# Patient Record
Sex: Female | Born: 1955 | Race: White | Hispanic: No | Marital: Married | Smoking: Never smoker
Health system: Southern US, Community
[De-identification: ages and names within clinical notes are randomized; demographics above are authoritative.]

## PROBLEM LIST (undated history)

## (undated) HISTORY — PX: BUNIONECTOMY: SHX129

## (undated) HISTORY — PX: TONSILLECTOMY: SUR1361

---

## 1998-06-08 ENCOUNTER — Other Ambulatory Visit: Admission: RE | Admit: 1998-06-08 | Discharge: 1998-06-08 | Payer: Self-pay | Admitting: Obstetrics and Gynecology

## 2001-01-28 ENCOUNTER — Encounter: Payer: Self-pay | Admitting: Emergency Medicine

## 2001-01-28 ENCOUNTER — Encounter: Admission: RE | Admit: 2001-01-28 | Discharge: 2001-01-28 | Payer: Self-pay | Admitting: Emergency Medicine

## 2001-08-15 ENCOUNTER — Other Ambulatory Visit: Admission: RE | Admit: 2001-08-15 | Discharge: 2001-08-15 | Payer: Self-pay | Admitting: Emergency Medicine

## 2002-08-14 ENCOUNTER — Encounter: Admission: RE | Admit: 2002-08-14 | Discharge: 2002-08-14 | Payer: Self-pay | Admitting: Emergency Medicine

## 2002-08-14 ENCOUNTER — Encounter: Payer: Self-pay | Admitting: Emergency Medicine

## 2003-11-24 ENCOUNTER — Encounter: Admission: RE | Admit: 2003-11-24 | Discharge: 2003-11-24 | Payer: Self-pay | Admitting: Emergency Medicine

## 2006-04-02 ENCOUNTER — Encounter: Admission: RE | Admit: 2006-04-02 | Discharge: 2006-04-02 | Payer: Self-pay | Admitting: Emergency Medicine

## 2007-07-04 IMAGING — MG MM SCREEN MAMMOGRAM BILATERAL
4 series · 4 of 4 positions shown · non-contrast
Comparison: none

Bilateral CC and MLO view(s) were taken.

SCREENING MAMMOGRAM:
There is a fibroglandular pattern.  No masses or malignant type calcifications are identified.  
Compared with prior studies.

[R CC]
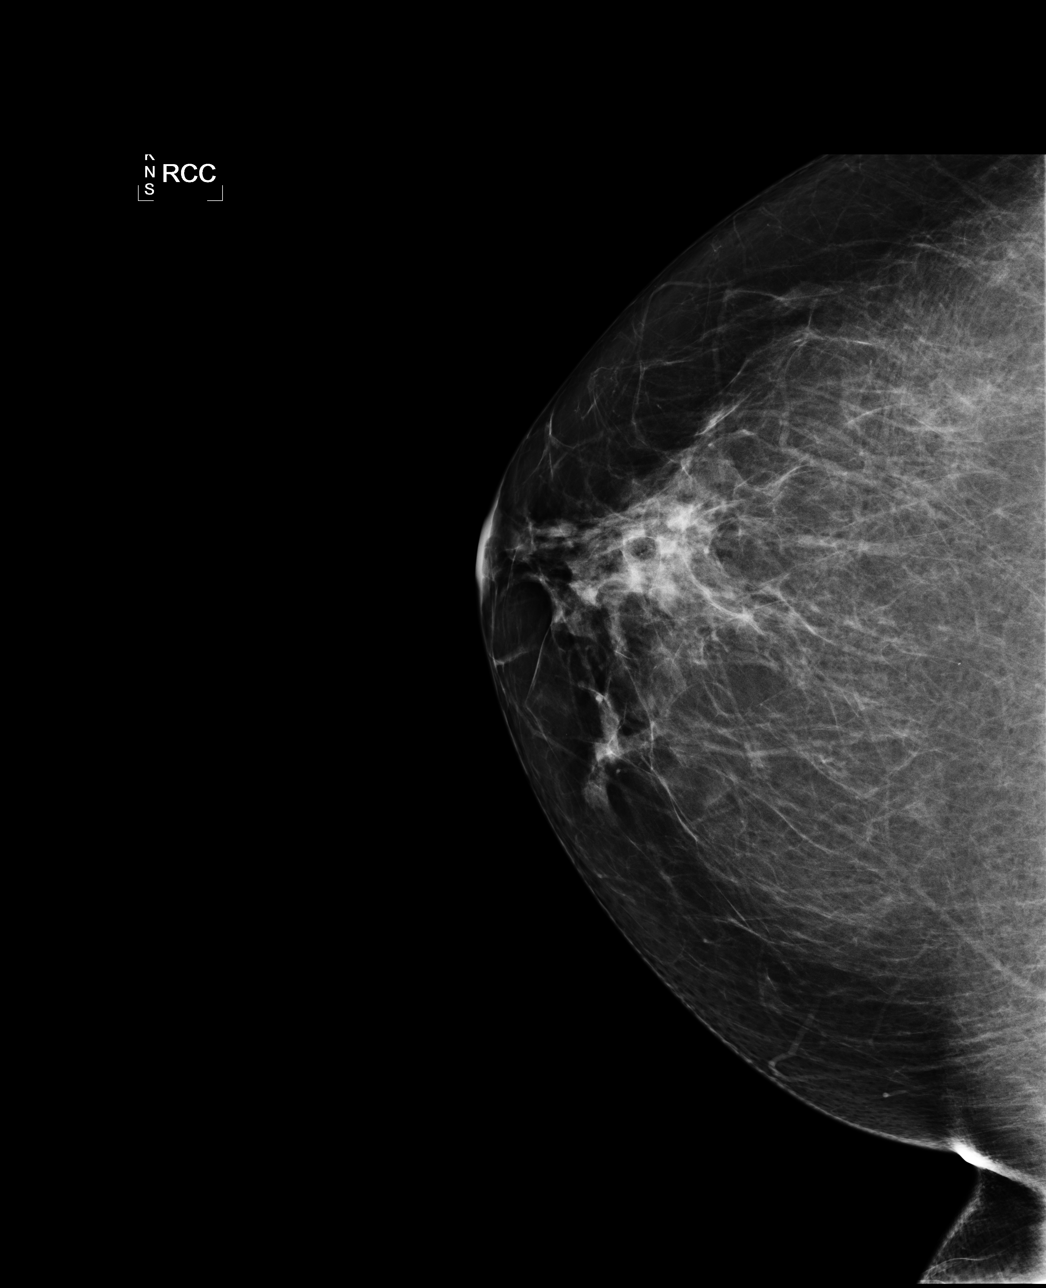

[L CC]
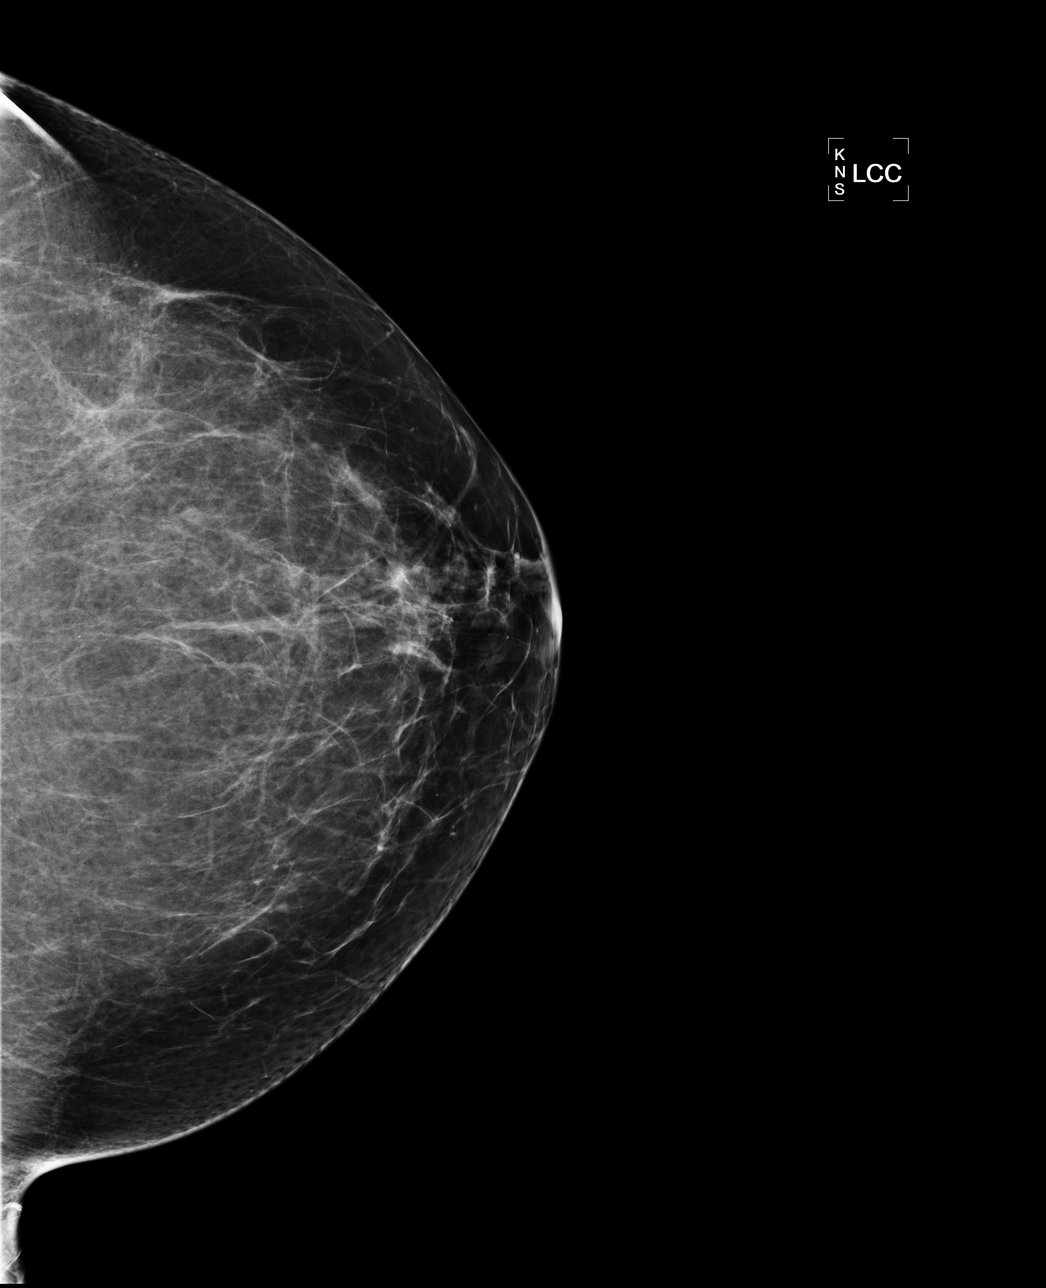

[L MLO]
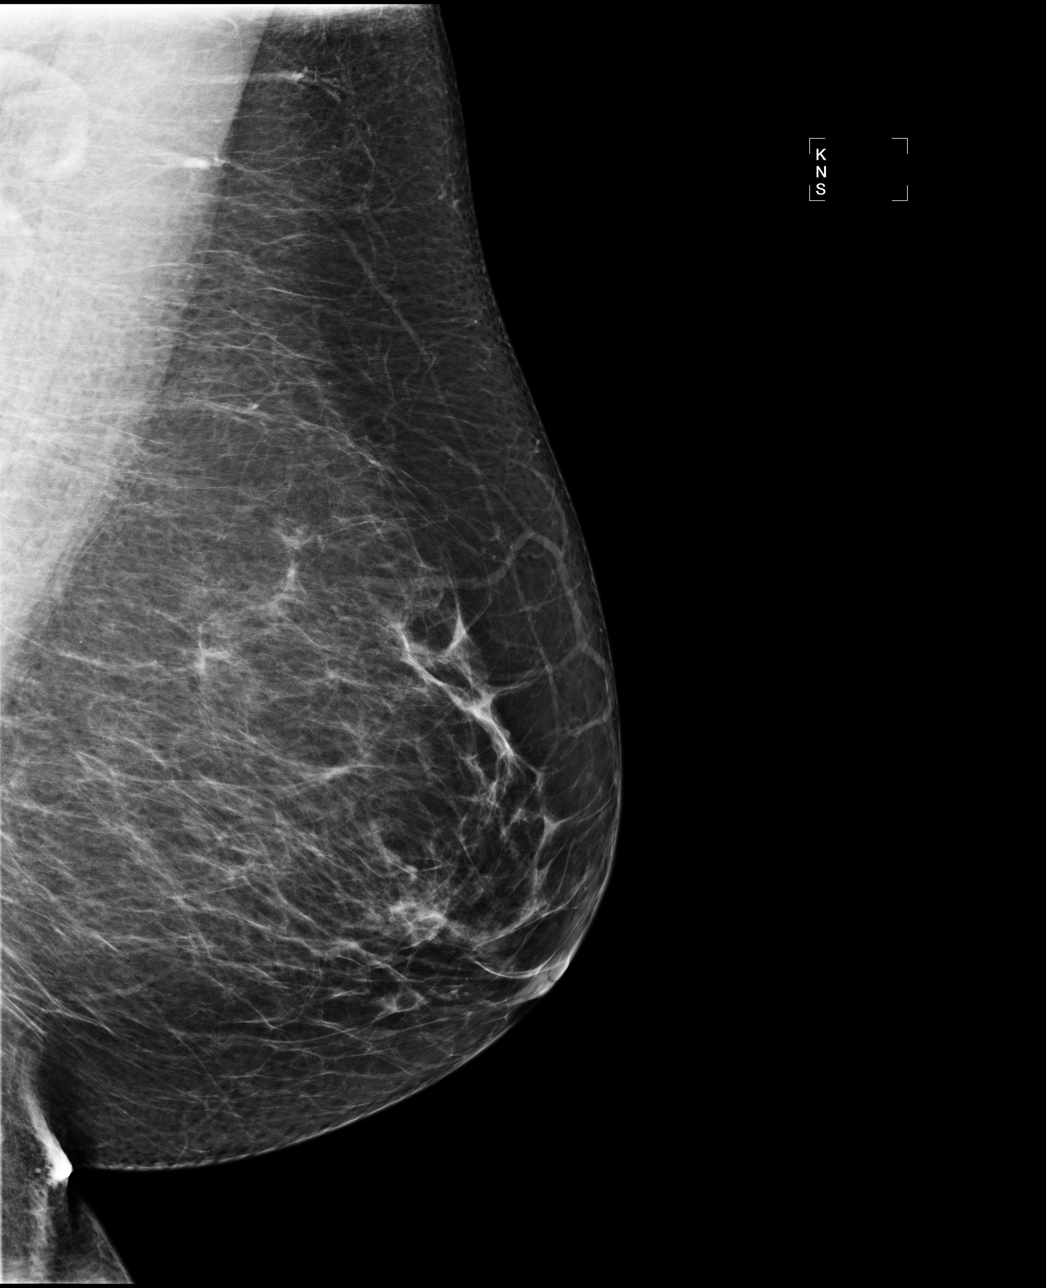

[R MLO]
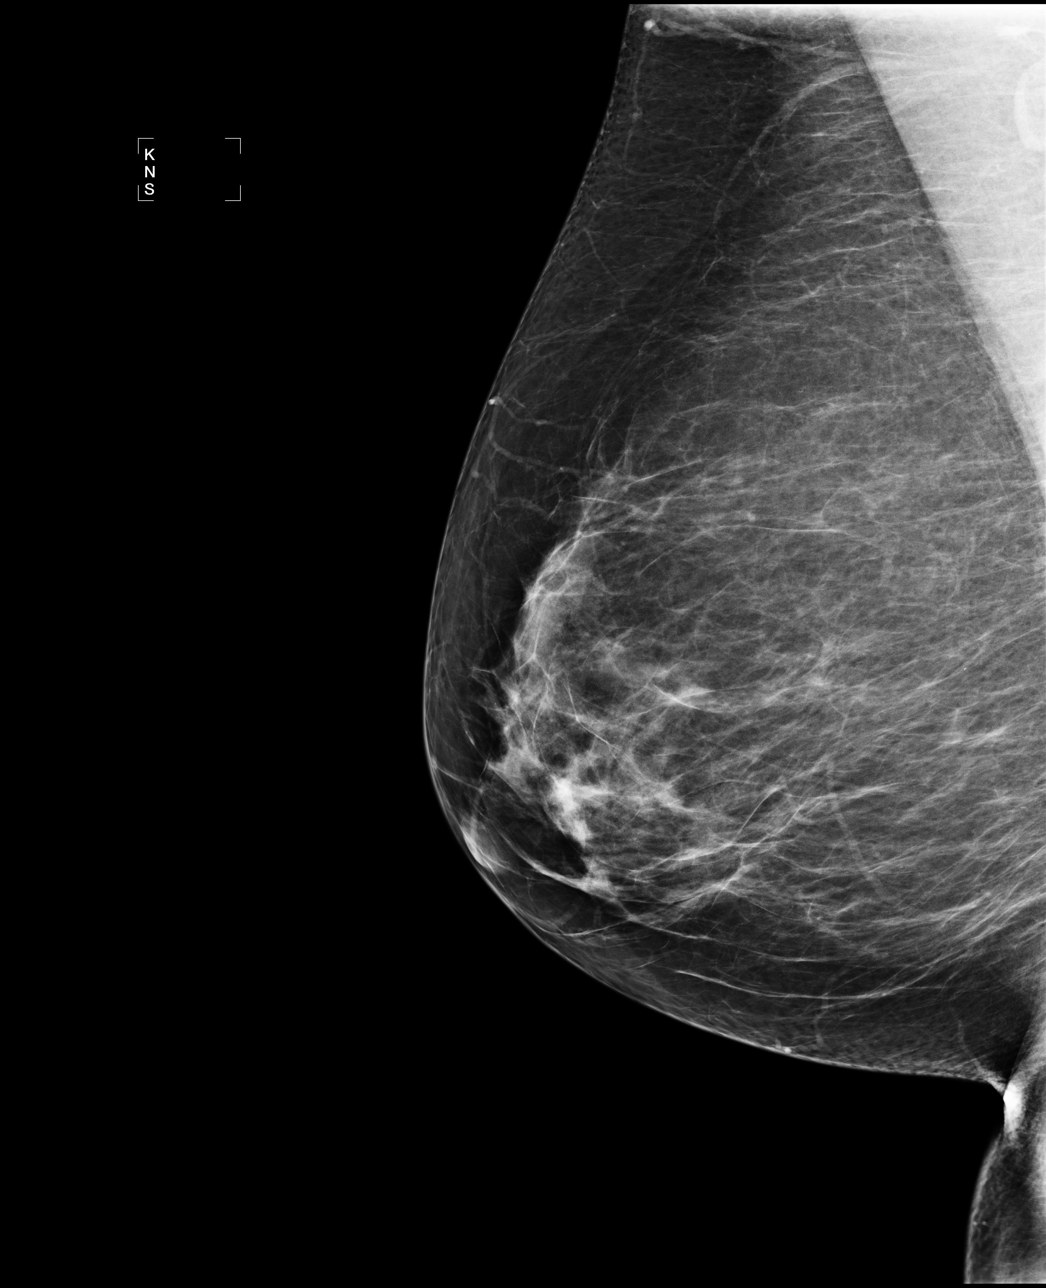

[4 of 4 positions shown; findings below may reference images not displayed]

IMPRESSION: No specific mammographic evidence of malignancy.  Next screening mammogram is recommended in one 
year.

ASSESSMENT: Negative - BI-RADS 1

Screening mammogram in 1 year.

## 2007-09-09 ENCOUNTER — Encounter: Admission: RE | Admit: 2007-09-09 | Discharge: 2007-09-09 | Payer: Self-pay | Admitting: Emergency Medicine

## 2007-09-12 ENCOUNTER — Encounter: Admission: RE | Admit: 2007-09-12 | Discharge: 2007-09-12 | Payer: Self-pay | Admitting: Emergency Medicine

## 2008-03-11 ENCOUNTER — Encounter: Admission: RE | Admit: 2008-03-11 | Discharge: 2008-03-11 | Payer: Self-pay | Admitting: Emergency Medicine

## 2008-09-09 ENCOUNTER — Encounter: Admission: RE | Admit: 2008-09-09 | Discharge: 2008-09-09 | Payer: Self-pay | Admitting: Emergency Medicine

## 2009-03-18 IMAGING — CR DG KNEE*R* EXAM
4 series · 4 of 4 positions shown · non-contrast
Comparison: none

[other (1 of 2)]
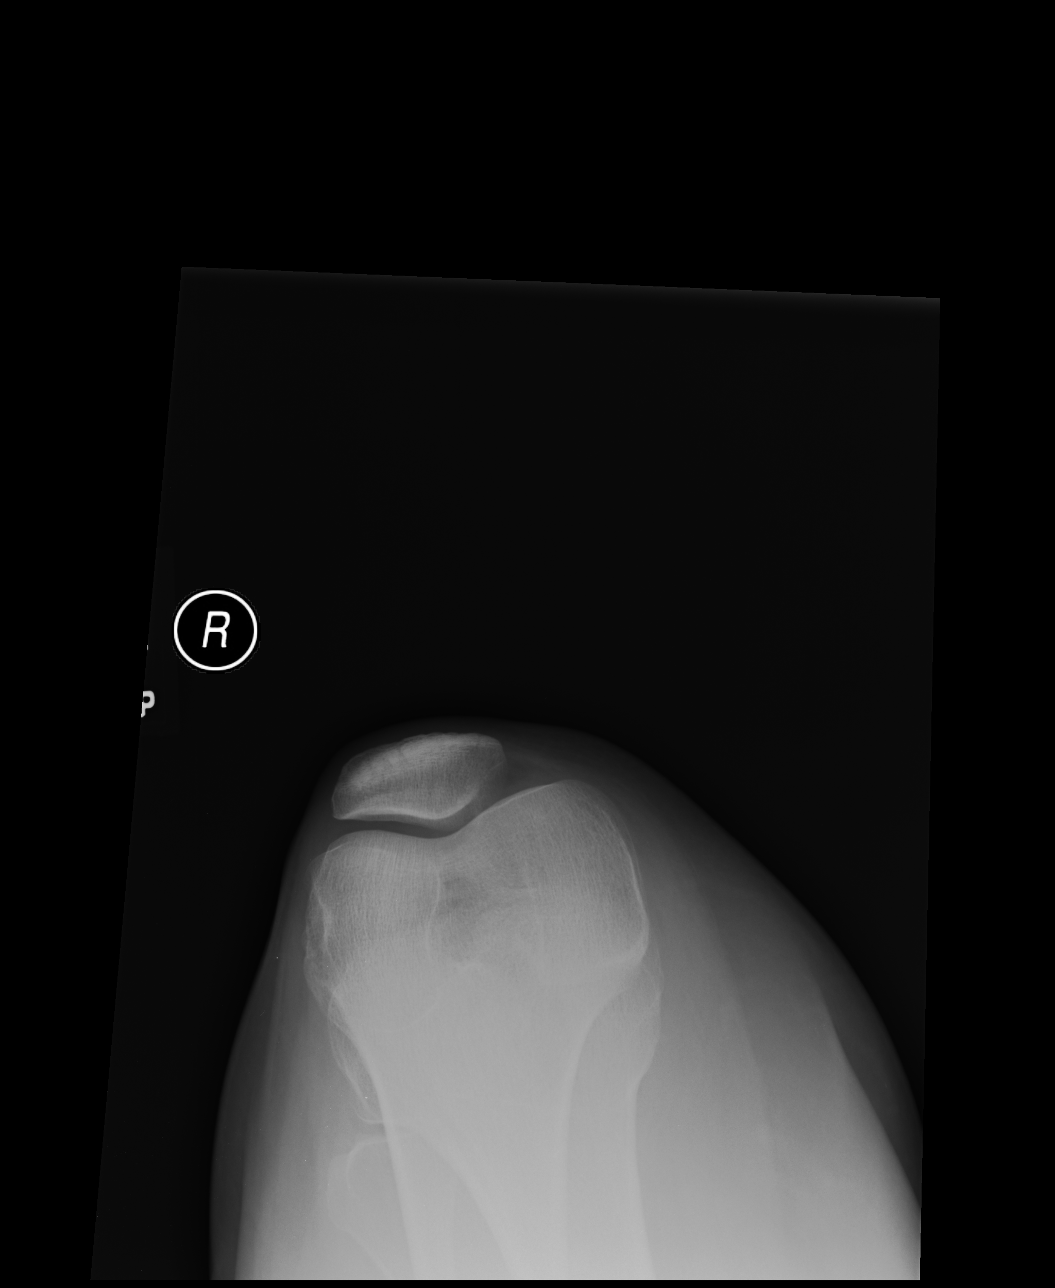

[AP]
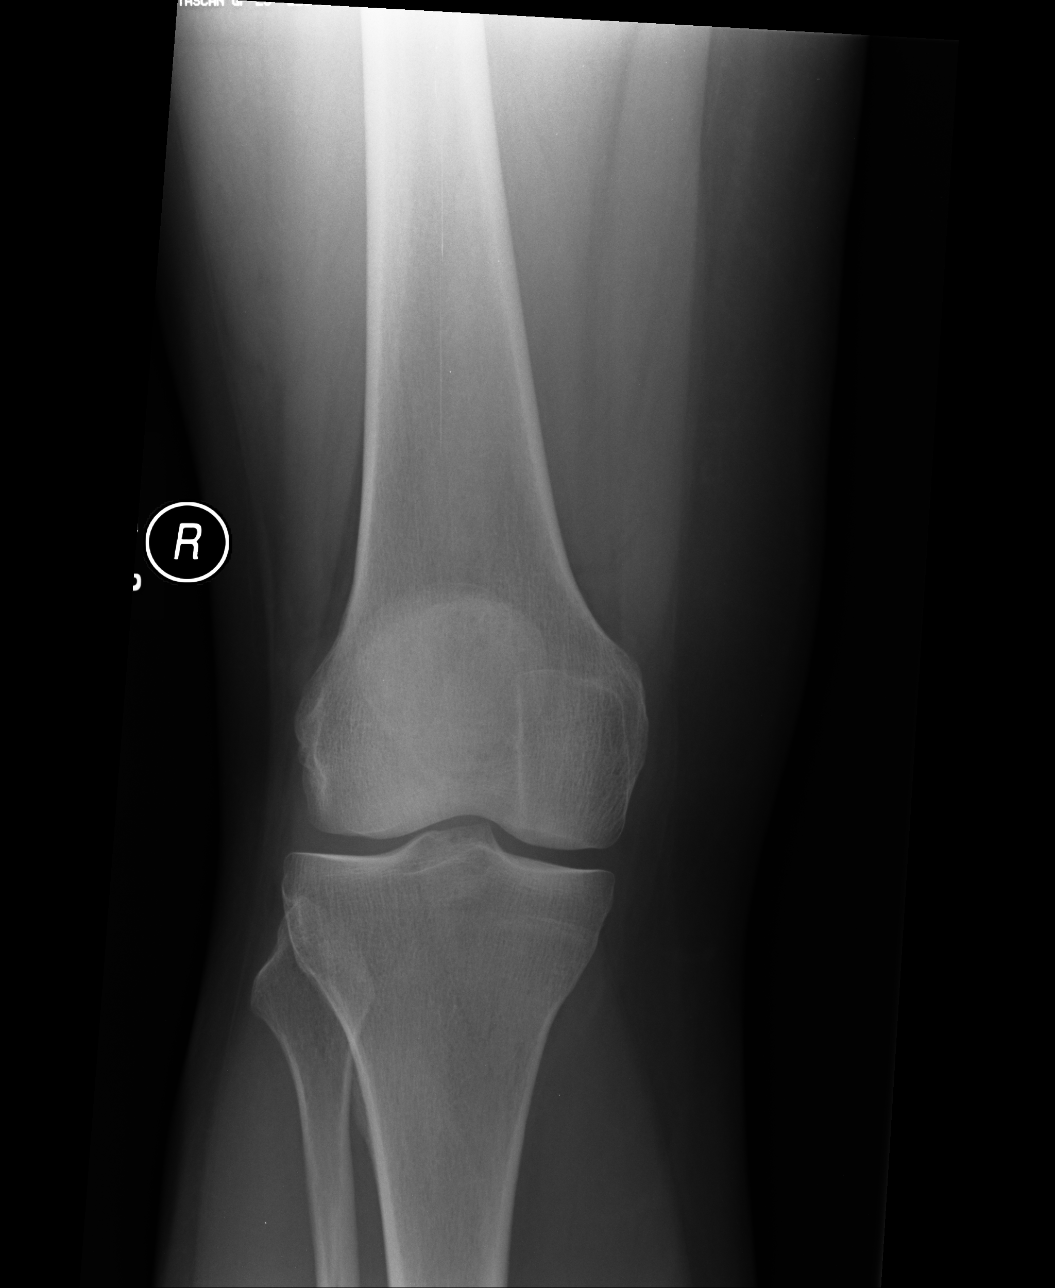

[lateral]
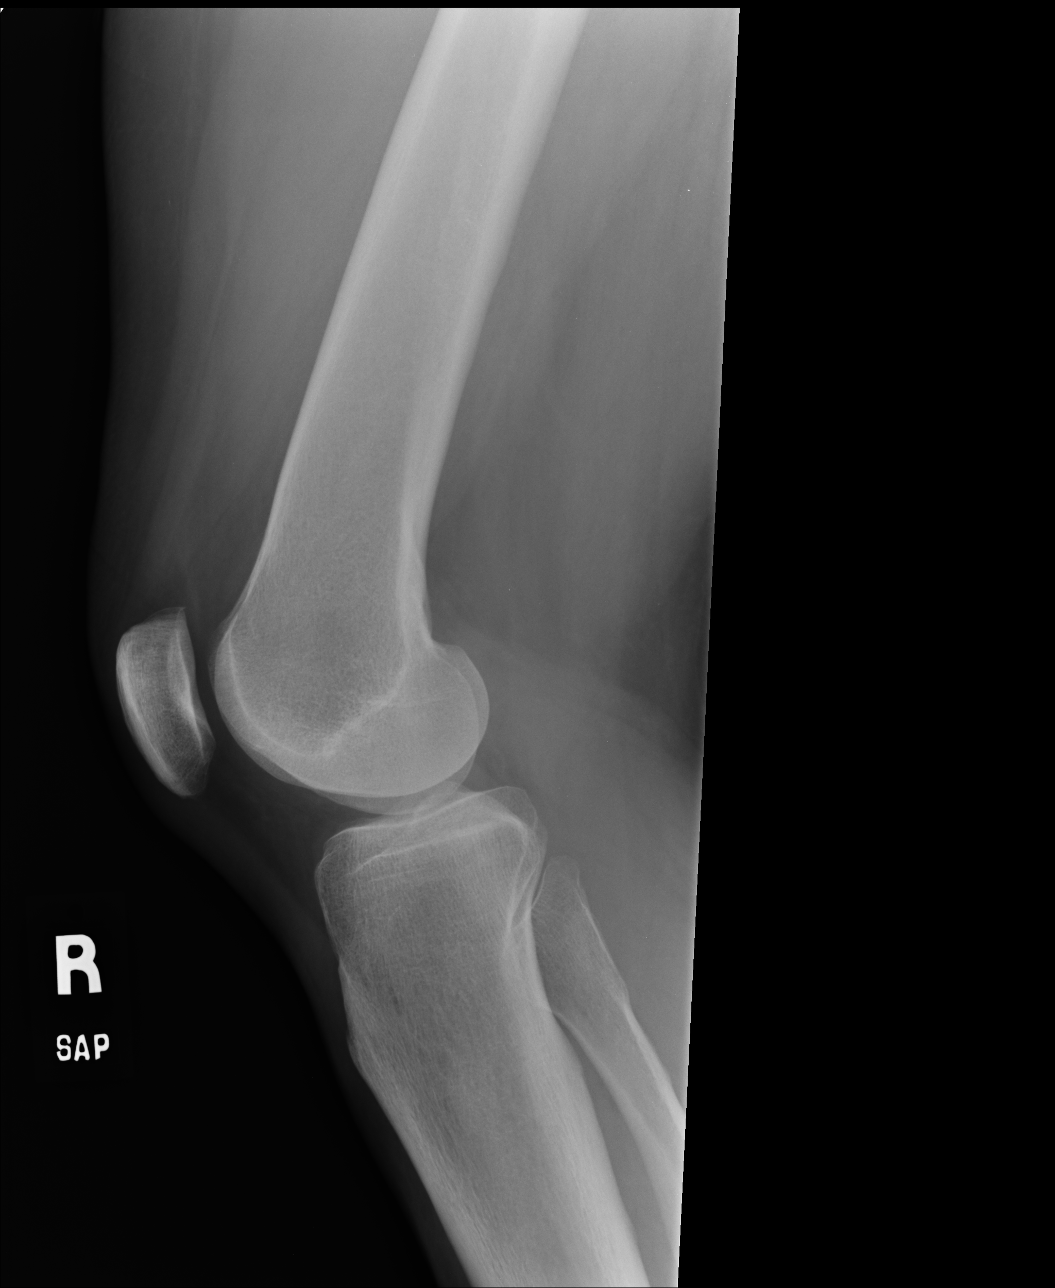

[other (2 of 2)]
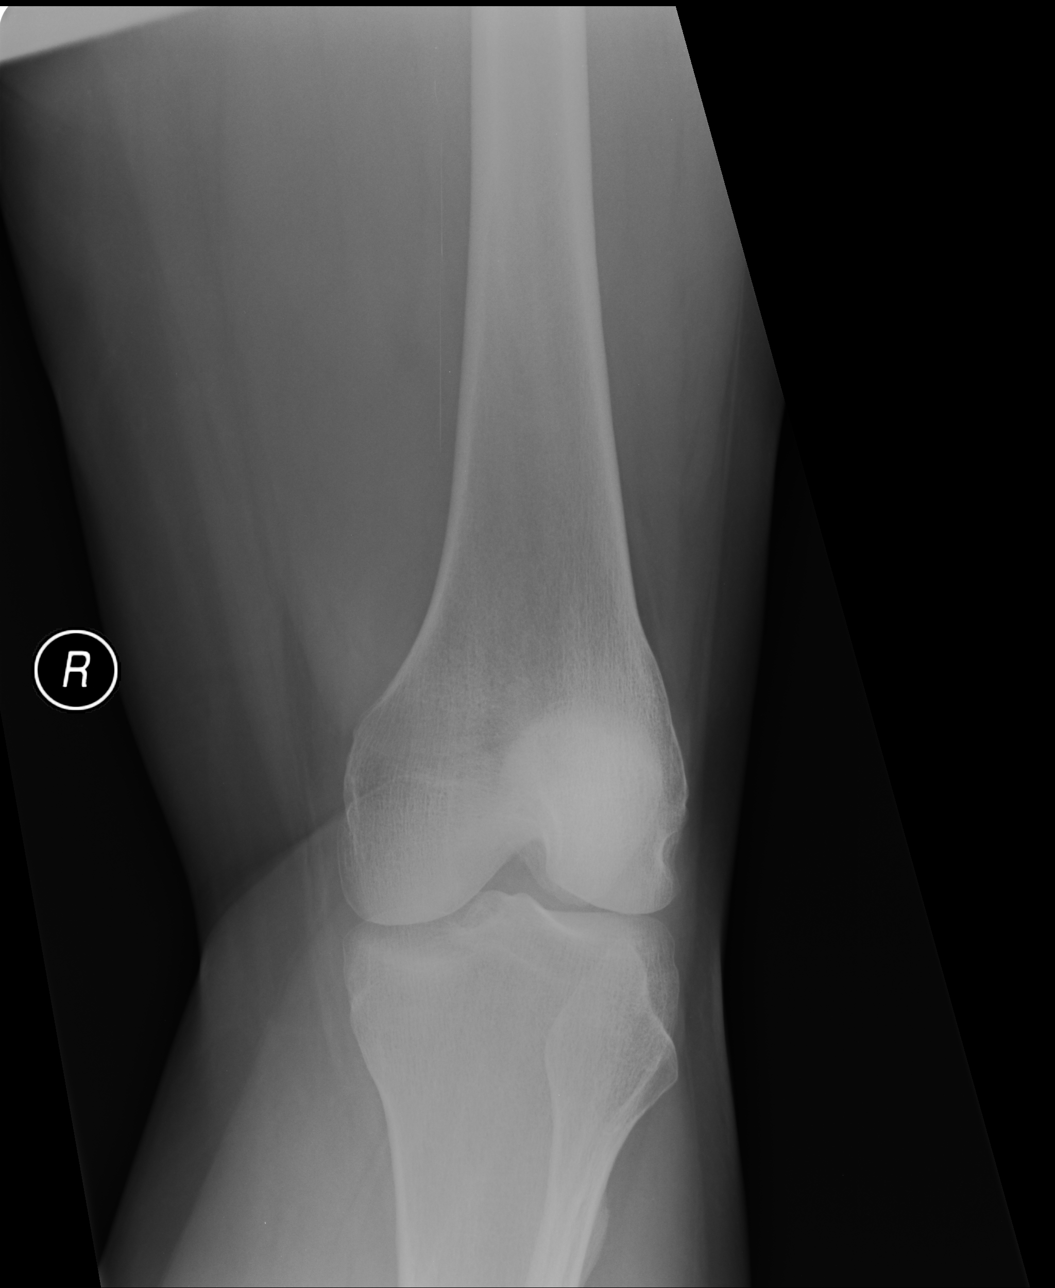

[4 of 4 positions shown; findings below may reference images not displayed]

Canned report from images found in remote index.

Refer to host system for actual result text.

## 2009-09-13 ENCOUNTER — Encounter: Admission: RE | Admit: 2009-09-13 | Discharge: 2009-09-13 | Payer: Self-pay | Admitting: Family Medicine

## 2010-09-15 ENCOUNTER — Encounter: Admission: RE | Admit: 2010-09-15 | Discharge: 2010-09-15 | Payer: Self-pay | Admitting: Internal Medicine

## 2011-09-12 ENCOUNTER — Other Ambulatory Visit: Payer: Self-pay | Admitting: Physician Assistant

## 2011-09-12 DIAGNOSIS — Z1231 Encounter for screening mammogram for malignant neoplasm of breast: Secondary | ICD-10-CM

## 2011-09-18 ENCOUNTER — Ambulatory Visit
Admission: RE | Admit: 2011-09-18 | Discharge: 2011-09-18 | Disposition: A | Discharge status: Home / Self Care | Payer: BC Managed Care – PPO | Source: Ambulatory Visit | Attending: Physician Assistant | Admitting: Physician Assistant

## 2011-09-18 DIAGNOSIS — Z1231 Encounter for screening mammogram for malignant neoplasm of breast: Secondary | ICD-10-CM

## 2012-09-12 ENCOUNTER — Other Ambulatory Visit: Payer: Self-pay | Admitting: Physician Assistant

## 2012-09-12 DIAGNOSIS — Z1231 Encounter for screening mammogram for malignant neoplasm of breast: Secondary | ICD-10-CM

## 2012-10-07 ENCOUNTER — Ambulatory Visit
Admission: RE | Admit: 2012-10-07 | Discharge: 2012-10-07 | Disposition: A | Discharge status: Home / Self Care | Payer: BC Managed Care – PPO | Source: Ambulatory Visit | Attending: Physician Assistant | Admitting: Physician Assistant

## 2012-10-07 DIAGNOSIS — Z1231 Encounter for screening mammogram for malignant neoplasm of breast: Secondary | ICD-10-CM

## 2013-09-24 ENCOUNTER — Other Ambulatory Visit: Payer: Self-pay

## 2013-09-24 DIAGNOSIS — Z1231 Encounter for screening mammogram for malignant neoplasm of breast: Secondary | ICD-10-CM

## 2013-10-14 ENCOUNTER — Ambulatory Visit
Admission: RE | Admit: 2013-10-14 | Discharge: 2013-10-14 | Disposition: A | Discharge status: Home / Self Care | Payer: BC Managed Care – PPO | Source: Ambulatory Visit

## 2013-10-14 DIAGNOSIS — Z1231 Encounter for screening mammogram for malignant neoplasm of breast: Secondary | ICD-10-CM

## 2014-10-05 ENCOUNTER — Other Ambulatory Visit: Payer: Self-pay

## 2014-10-05 DIAGNOSIS — Z1231 Encounter for screening mammogram for malignant neoplasm of breast: Secondary | ICD-10-CM

## 2014-10-14 ENCOUNTER — Ambulatory Visit (INDEPENDENT_AMBULATORY_CARE_PROVIDER_SITE_OTHER): Payer: BC Managed Care – PPO | Admitting: Physician Assistant

## 2014-10-14 VITALS — BP 126/84 | HR 63 | Temp 97.8°F | Resp 20 | Ht 64.0 in | Wt 175.8 lb

## 2014-10-14 DIAGNOSIS — R05 Cough: Secondary | ICD-10-CM

## 2014-10-14 DIAGNOSIS — R059 Cough, unspecified: Secondary | ICD-10-CM

## 2014-10-14 DIAGNOSIS — R062 Wheezing: Secondary | ICD-10-CM

## 2014-10-14 MED ORDER — IPRATROPIUM BROMIDE 0.02 % IN SOLN
0.5000 mg | Freq: Once | RESPIRATORY_TRACT | Status: AC
  Administered 2014-10-14: 0.5 mg via RESPIRATORY_TRACT

## 2014-10-14 MED ORDER — HYDROCODONE-HOMATROPINE 5-1.5 MG/5ML PO SYRP: 5.0000 mL | ORAL_SOLUTION | Freq: Three times a day (TID) | ORAL | Status: DC | PRN

## 2014-10-14 MED ORDER — BENZONATATE 100 MG PO CAPS: 100.0000 mg | ORAL_CAPSULE | Freq: Three times a day (TID) | ORAL | Status: DC | PRN

## 2014-10-14 MED ORDER — ALBUTEROL SULFATE HFA 108 (90 BASE) MCG/ACT IN AERS: 2.0000 | INHALATION_SPRAY | RESPIRATORY_TRACT | Status: AC | PRN

## 2014-10-14 MED ORDER — ALBUTEROL SULFATE (2.5 MG/3ML) 0.083% IN NEBU
2.5000 mg | INHALATION_SOLUTION | Freq: Once | RESPIRATORY_TRACT | Status: AC
  Administered 2014-10-14: 2.5 mg via RESPIRATORY_TRACT

## 2014-10-14 NOTE — Progress Notes (Signed)
I was directly involved with the patient's care and agree with the physical, diagnosis and treatment plan.  

## 2014-10-14 NOTE — Patient Instructions (Signed)
Your cough is most likely due to a viral upper respiratory infection. Please take the tessalon as prescribed.  Please take the hycodan as prescribed. This will make you sleepy so you may want to take the tessalon during the day and hycodan at night. Your wheezing sounded better after the breathing treatment.  Please use the albuterol every 4-6 hours as needed for the wheeze.  If your wheezing continues despite using the albuterol, if you start to develop fevers, or if your cough worsens or becomes productive, please return to the clinic.

## 2014-10-14 NOTE — Progress Notes (Signed)
Subjective:    Patient ID: Taylor CoveVictoria Hunkins, female    DOB: 01/20/1956, 58 y.o.   MRN: [id number]  No primary care provider on file.  Chief Complaint  Patient presents with  . Cough    cough that is non productive all week.  cough has been worse at night until today--coughing all day even with taking delsym.    There are no active problems to display for this patient.  Prior to Admission medications   Not on File   Medications, allergies, past medical history, surgical history, family history, social history and problem list reviewed and updated.  HPI  [age] yof with no PMH presents today with sore throat, cough, and wheezing.  Sx intially started with sore throat 5 days ago. This has gradually resolved but cough started 3 days. Has been non-productive but has been keeping her up at night. Coughing throughout the day. Delsym helped initially but no help today. Shona NeedlesYest and today she and her husband noticed that she was wheezing occasionally. No hx asthma. She thinks she had wheezing many yrs ago as she remembers being prescribed albuterol yrs ago. Has not used in yrs though. She denies ear pain, fever, chills, abd pain, N/V, or diarrhea. Pos SOB with her coughing spells but otherwise none.   She had similar sx with cough and sore throat one month which resolved after a week, no wheeze at that time.   She has seasonal allergies in the fall. Takes antihistamine daily.  Got flu vaccine this year.   Review of Systems No CP. See HPI.     Objective:   Physical Exam  Constitutional: She is oriented to person, place, and time. She appears well-developed and well-nourished.  Non-toxic appearance. She does not have a sickly appearance. She does not appear ill. No distress.  BP 126/84 mmHg  Pulse 63  Temp(Src) 97.8 F (36.6 C) (Oral)  Resp 20  Ht 5\' 4"  (1.626 m)  Wt 175 lb 12.8 oz (79.742 kg)  BMI 30.16 kg/m2  SpO2 97%   HENT:  Head: Normocephalic and atraumatic.  Right Ear: Hearing,  tympanic membrane, external ear and ear canal normal.  Left Ear: Hearing, tympanic membrane, external ear and ear canal normal.  Nose: Nose normal. No mucosal edema or rhinorrhea. Right sinus exhibits no maxillary sinus tenderness and no frontal sinus tenderness. Left sinus exhibits no maxillary sinus tenderness and no frontal sinus tenderness.  Mouth/Throat: Uvula is midline, oropharynx is clear and moist and mucous membranes are normal. No oropharyngeal exudate, posterior oropharyngeal edema, posterior oropharyngeal erythema or tonsillar abscesses.  Several petechial spots on uvula but no surrounding erythema.   Cardiovascular: Normal rate, regular rhythm, S1 normal, S2 normal and normal heart sounds.  Exam reveals no gallop.   No murmur heard. Pulmonary/Chest: Effort normal. No accessory muscle usage. No tachypnea. No respiratory distress. She has no decreased breath sounds. She has wheezes in the right middle field, the right lower field and the left upper field. She has rhonchi in the right upper field, the right middle field, the right lower field, the left upper field and the left middle field. She has no rales.  Lymphadenopathy:       Head (right side): No submental, no submandibular and no tonsillar adenopathy present.       Head (left side): No submental, no submandibular and no tonsillar adenopathy present.    She has no cervical adenopathy.  Neurological: She is alert and oriented to person, place, and time.  Psychiatric: She has a normal mood and affect. Her speech is normal.   Breathing tx with albuterol/ipratroprium nebulizer. Breath sounds post tx: No wheezes. No rales. Rhonchi RLL.      Assessment & Plan:   [age] yof with no PMH presents today with sore throat, cough, and wheezing.  Wheezing - Plan: albuterol (PROVENTIL) (2.5 MG/3ML) 0.083% nebulizer solution 2.5 mg, ipratropium (ATROVENT) nebulizer solution 0.5 mg, albuterol (PROVENTIL HFA;VENTOLIN HFA) 108 (90 BASE) MCG/ACT  inhaler --Most likely secondary to viral uri --Resolved in clinic with breathing tx --Albuterol prn --RTC if wheezing doesn't resolve with albuterol  Cough - Plan: benzonatate (TESSALON) 100 MG capsule, HYDROcodone-homatropine (HYCODAN) 5-1.5 MG/5ML syrup --Hycodan night --Tessalon day --RTC if not resolving in 5 days or if becomes productive for several days. --RTC if fevers begin  Donnajean Lopesodd M. Kamyla Olejnik, PA-C Physician Assistant-Certified [facility]  10/14/2014 7:55 PM

## 2014-10-19 ENCOUNTER — Ambulatory Visit
Admission: RE | Admit: 2014-10-19 | Discharge: 2014-10-19 | Disposition: A | Discharge status: Home / Self Care | Payer: BC Managed Care – PPO | Source: Ambulatory Visit

## 2014-10-19 DIAGNOSIS — Z1231 Encounter for screening mammogram for malignant neoplasm of breast: Secondary | ICD-10-CM

## 2015-10-11 ENCOUNTER — Other Ambulatory Visit: Payer: Self-pay

## 2015-10-11 DIAGNOSIS — Z1231 Encounter for screening mammogram for malignant neoplasm of breast: Secondary | ICD-10-CM

## 2015-10-26 ENCOUNTER — Ambulatory Visit: Payer: BC Managed Care – PPO

## 2015-11-24 ENCOUNTER — Ambulatory Visit
Admission: RE | Admit: 2015-11-24 | Discharge: 2015-11-24 | Disposition: A | Discharge status: Home / Self Care | Payer: BC Managed Care – PPO | Source: Ambulatory Visit

## 2015-11-24 DIAGNOSIS — Z1231 Encounter for screening mammogram for malignant neoplasm of breast: Secondary | ICD-10-CM

## 2016-11-21 ENCOUNTER — Other Ambulatory Visit: Payer: Self-pay | Admitting: Physician Assistant

## 2016-11-21 DIAGNOSIS — Z1231 Encounter for screening mammogram for malignant neoplasm of breast: Secondary | ICD-10-CM

## 2016-12-12 ENCOUNTER — Ambulatory Visit
Admission: RE | Admit: 2016-12-12 | Discharge: 2016-12-12 | Disposition: A | Discharge status: Home / Self Care | Payer: BC Managed Care – PPO | Source: Ambulatory Visit | Attending: Physician Assistant | Admitting: Physician Assistant

## 2016-12-12 DIAGNOSIS — Z1231 Encounter for screening mammogram for malignant neoplasm of breast: Secondary | ICD-10-CM

## 2017-12-23 ENCOUNTER — Other Ambulatory Visit: Payer: Self-pay | Admitting: Physician Assistant

## 2017-12-23 DIAGNOSIS — Z1231 Encounter for screening mammogram for malignant neoplasm of breast: Secondary | ICD-10-CM

## 2018-01-10 ENCOUNTER — Ambulatory Visit
Admission: RE | Admit: 2018-01-10 | Discharge: 2018-01-10 | Disposition: A | Discharge status: Home / Self Care | Payer: BC Managed Care – PPO | Source: Ambulatory Visit | Attending: Physician Assistant | Admitting: Physician Assistant

## 2018-01-10 DIAGNOSIS — Z1231 Encounter for screening mammogram for malignant neoplasm of breast: Secondary | ICD-10-CM

## 2019-01-15 ENCOUNTER — Other Ambulatory Visit: Payer: Self-pay | Admitting: Physician Assistant

## 2019-01-15 DIAGNOSIS — Z1231 Encounter for screening mammogram for malignant neoplasm of breast: Secondary | ICD-10-CM

## 2019-02-09 ENCOUNTER — Ambulatory Visit
Admission: RE | Admit: 2019-02-09 | Discharge: 2019-02-09 | Disposition: A | Discharge status: Home / Self Care | Payer: BC Managed Care – PPO | Source: Ambulatory Visit | Attending: Physician Assistant | Admitting: Physician Assistant

## 2019-02-09 DIAGNOSIS — Z1231 Encounter for screening mammogram for malignant neoplasm of breast: Secondary | ICD-10-CM

## 2020-02-09 ENCOUNTER — Other Ambulatory Visit: Payer: Self-pay | Admitting: Physician Assistant

## 2020-02-09 DIAGNOSIS — Z1231 Encounter for screening mammogram for malignant neoplasm of breast: Secondary | ICD-10-CM

## 2020-03-31 ENCOUNTER — Ambulatory Visit
Admission: RE | Admit: 2020-03-31 | Discharge: 2020-03-31 | Disposition: A | Discharge status: Home / Self Care | Payer: BC Managed Care – PPO | Source: Ambulatory Visit | Attending: Physician Assistant | Admitting: Physician Assistant

## 2020-03-31 ENCOUNTER — Other Ambulatory Visit: Payer: Self-pay

## 2020-03-31 DIAGNOSIS — Z1231 Encounter for screening mammogram for malignant neoplasm of breast: Secondary | ICD-10-CM

## 2020-05-12 IMAGING — MG DIGITAL SCREENING BILATERAL MAMMOGRAM WITH TOMO AND CAD
8 series · 8 of 24 positions shown · non-contrast
Comparison: Previous exam(s).

CLINICAL DATA: Screening.

EXAM:
DIGITAL SCREENING BILATERAL MAMMOGRAM WITH TOMO AND CAD

[R CC synth-2D]
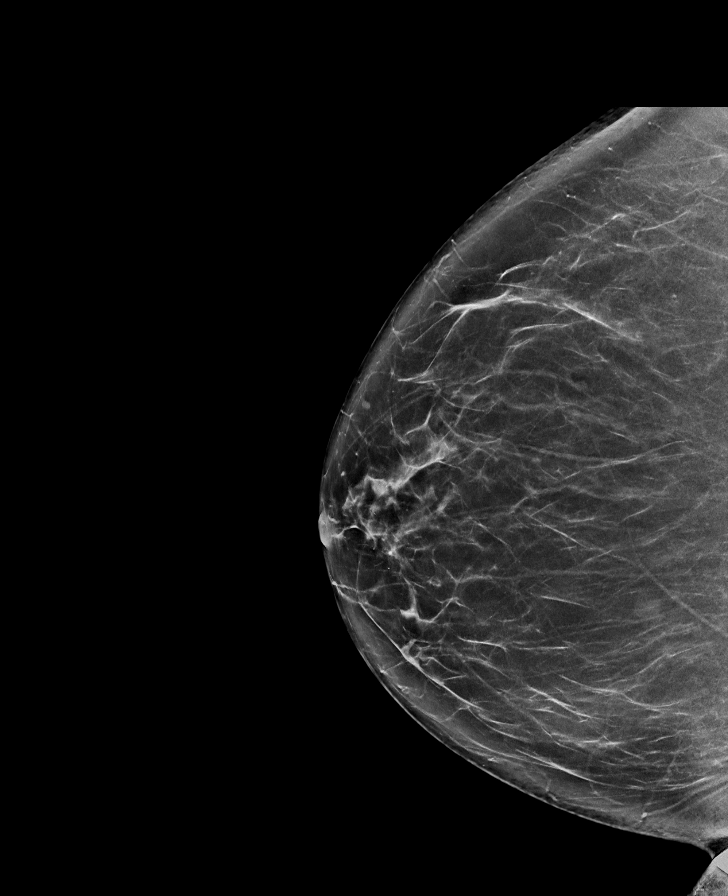

[L MLO synth-2D]
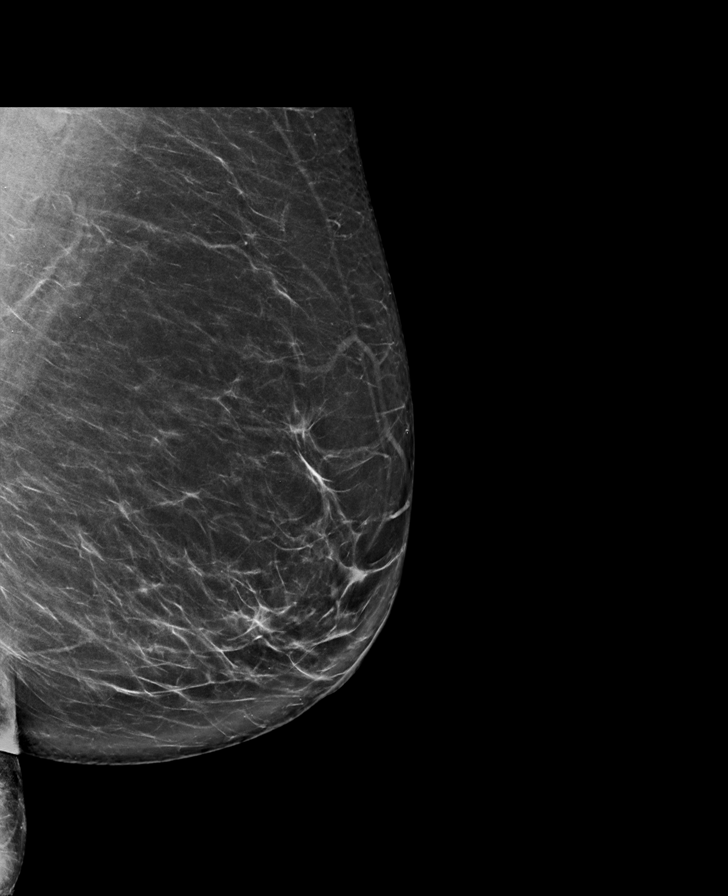

[R MLO synth-2D]
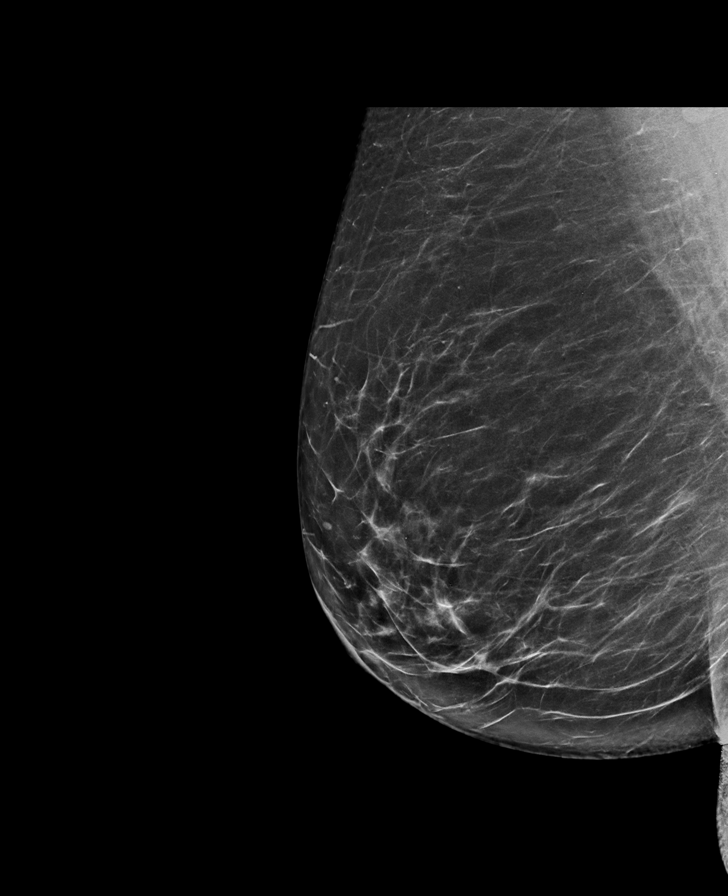

[L CC synth-2D]
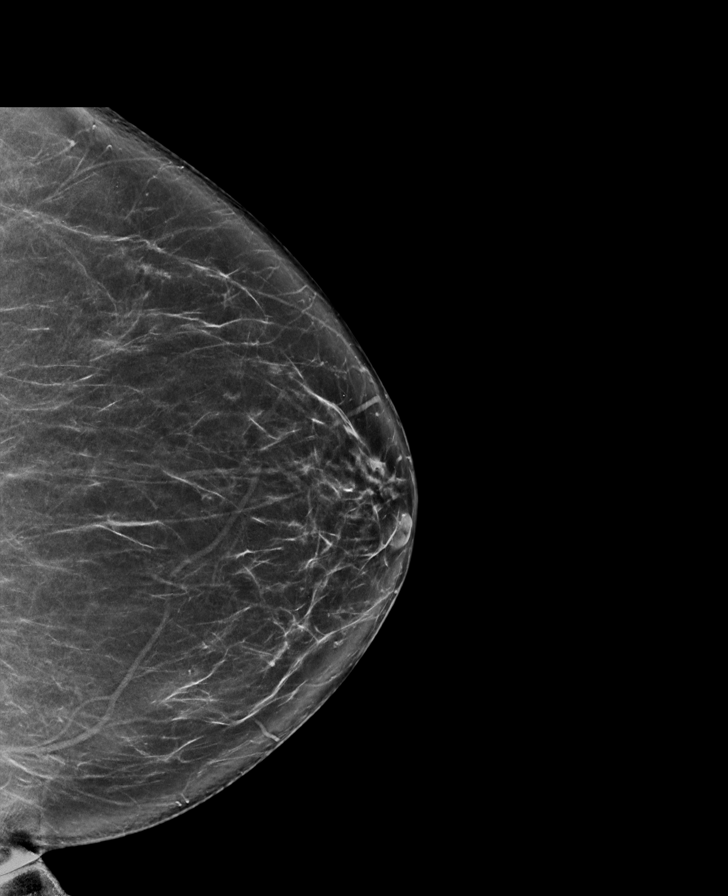

[R MLO tomo · tomo slice 45/89.0]
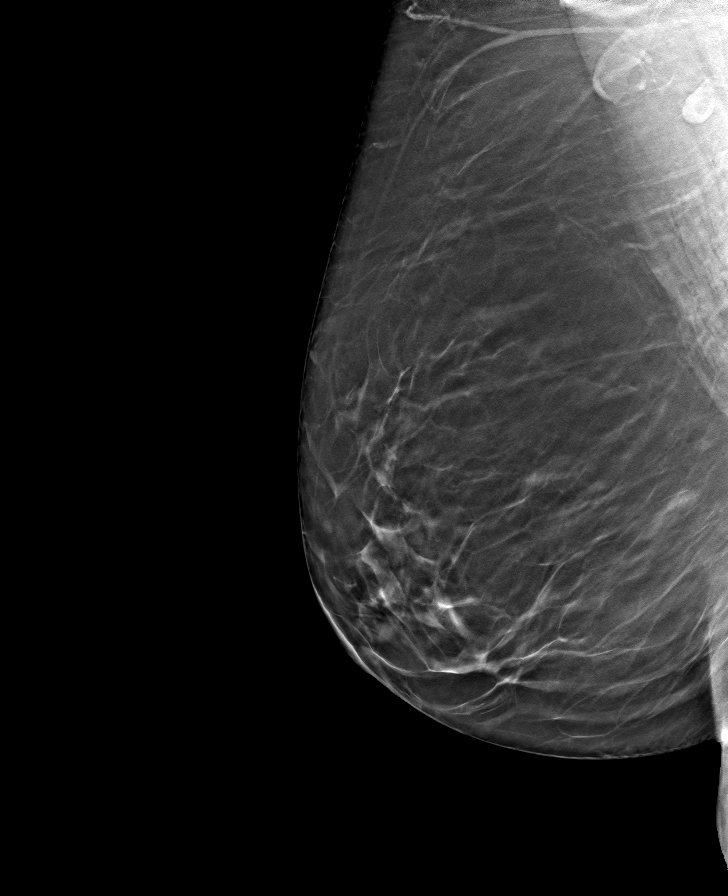

[L CC tomo · tomo slice 43/84.0]
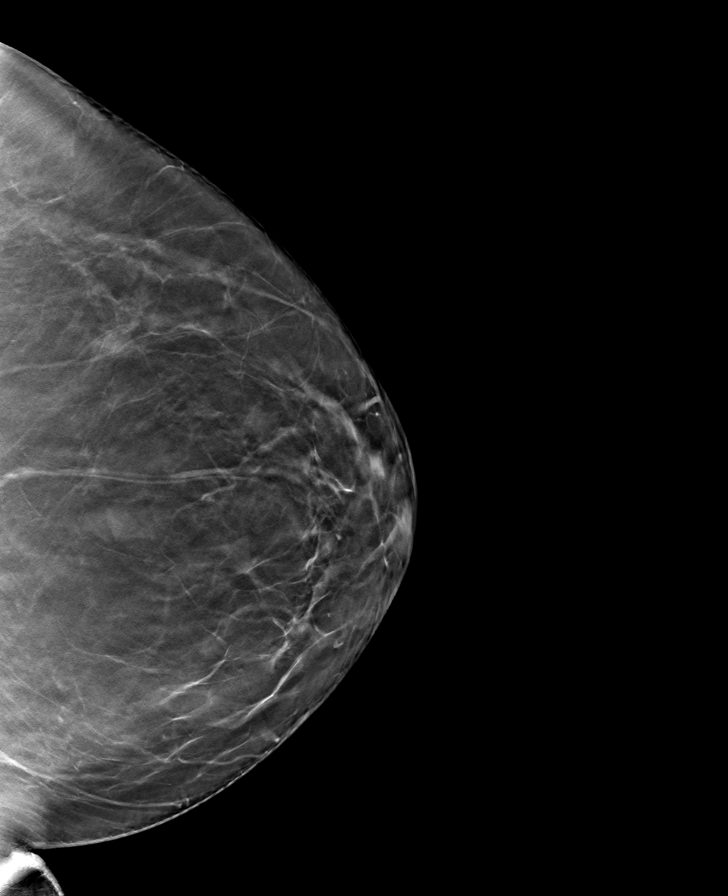

[R CC tomo · tomo slice 43/85.0]
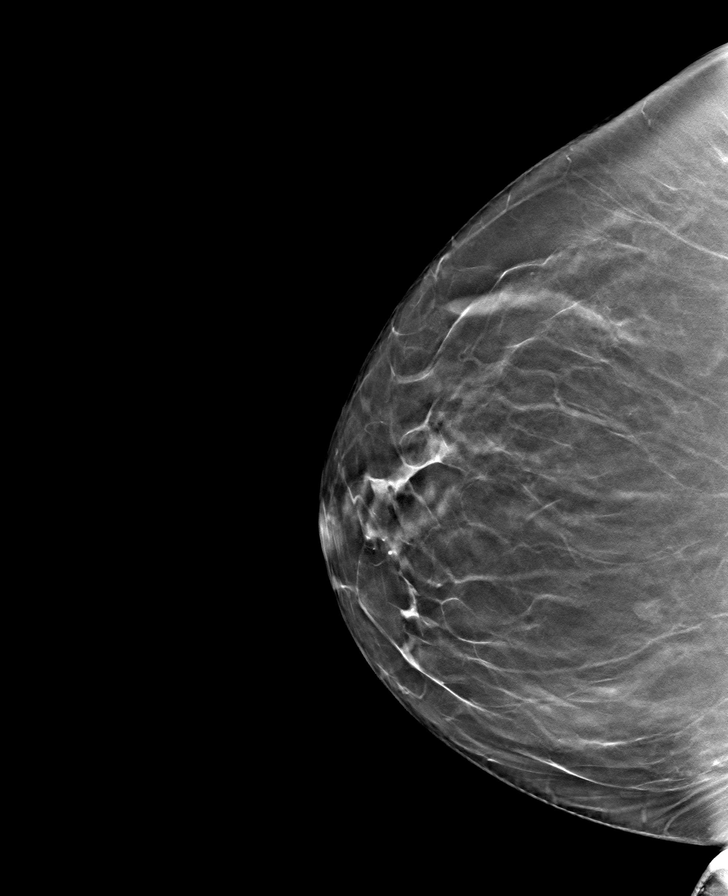

[L MLO tomo · tomo slice 45/88.0]
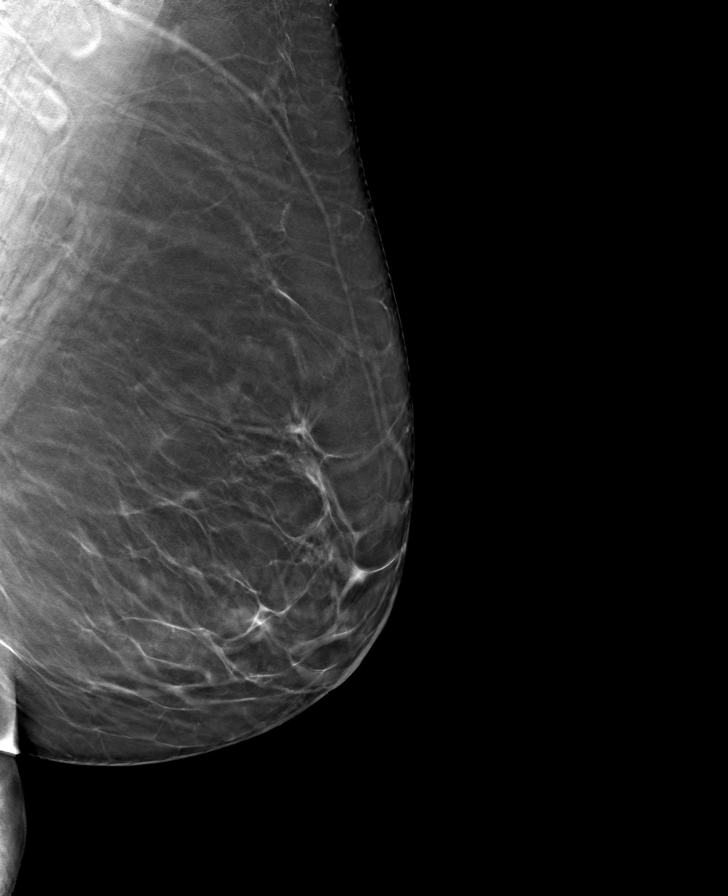

[8 of 24 positions shown; findings below may reference images not displayed]

ACR Breast Density Category b: There are scattered areas of
fibroglandular density.
FINDINGS: There are no findings suspicious for malignancy. Images were
processed with CAD.
IMPRESSION: No mammographic evidence of malignancy. A result letter of this
screening mammogram will be mailed directly to the patient.

RECOMMENDATION:
Screening mammogram in one year. (Code:[id number])

BI-RADS CATEGORY  1: Negative.

## 2021-03-13 ENCOUNTER — Other Ambulatory Visit: Payer: Self-pay | Admitting: Physician Assistant

## 2021-03-13 DIAGNOSIS — Z1231 Encounter for screening mammogram for malignant neoplasm of breast: Secondary | ICD-10-CM

## 2021-05-03 ENCOUNTER — Ambulatory Visit: Payer: BC Managed Care – PPO

## 2021-05-05 ENCOUNTER — Ambulatory Visit
Admission: RE | Admit: 2021-05-05 | Discharge: 2021-05-05 | Disposition: A | Discharge status: Home / Self Care | Payer: BC Managed Care – PPO | Source: Ambulatory Visit | Attending: Physician Assistant | Admitting: Physician Assistant

## 2021-05-05 ENCOUNTER — Other Ambulatory Visit: Payer: Self-pay

## 2021-05-05 DIAGNOSIS — Z1231 Encounter for screening mammogram for malignant neoplasm of breast: Secondary | ICD-10-CM

## 2021-07-02 IMAGING — MG DIGITAL SCREENING BILAT W/ TOMO W/ CAD
8 series · 8 of 24 positions shown · non-contrast
Comparison: Previous exam(s).

CLINICAL DATA: Screening.

EXAM:
DIGITAL SCREENING BILATERAL MAMMOGRAM WITH TOMO AND CAD

[R CC synth-2D]
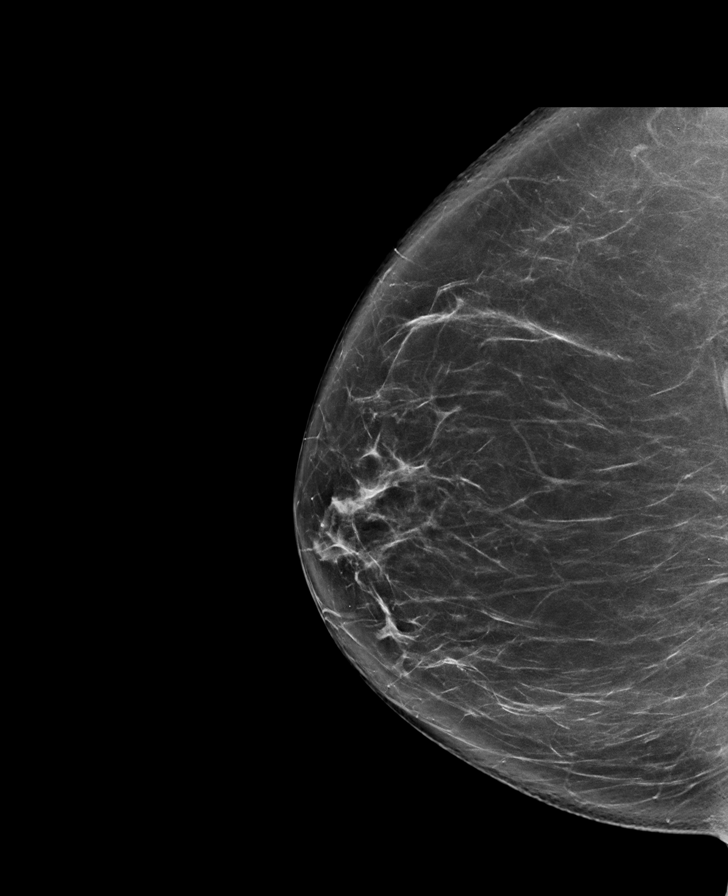

[L CC synth-2D]
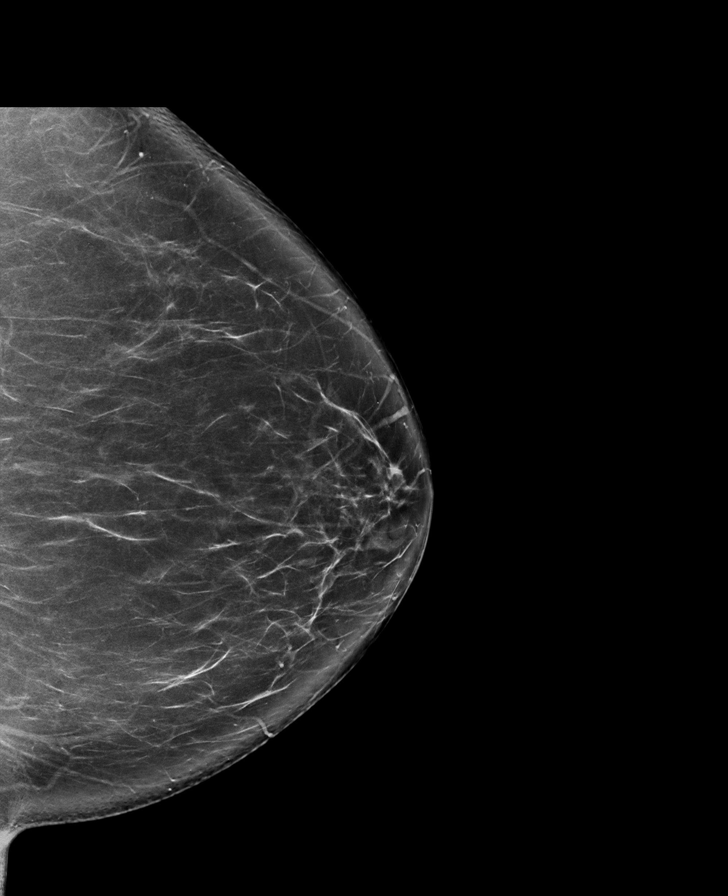

[R MLO synth-2D]
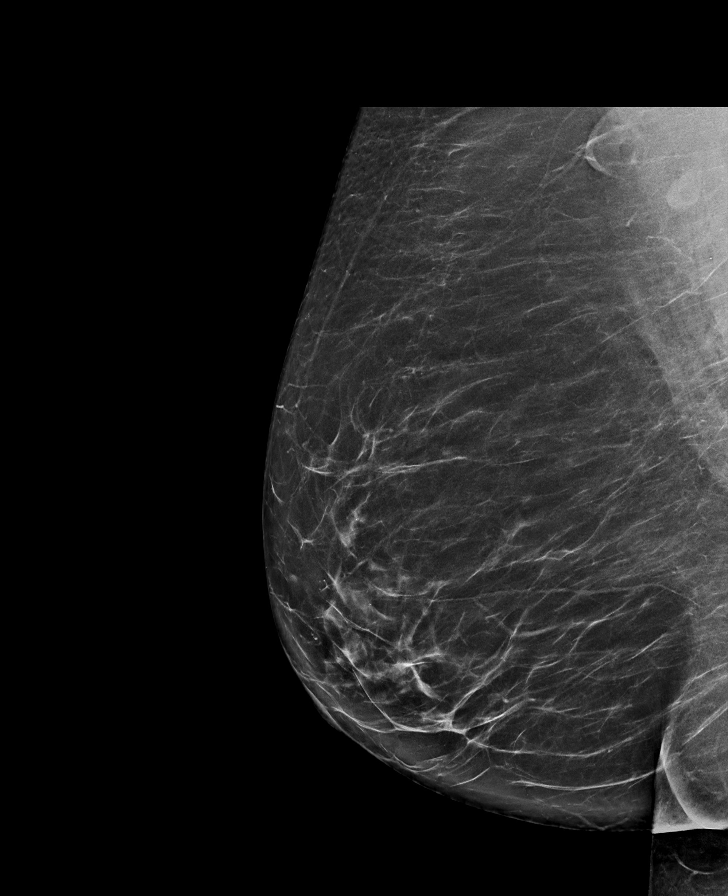

[L MLO synth-2D]
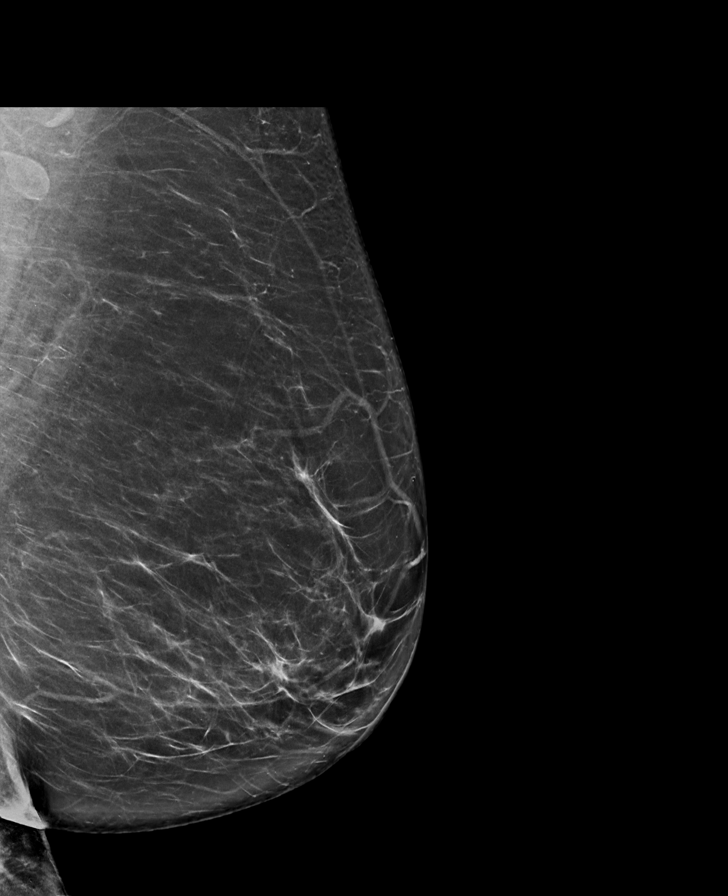

[R MLO tomo · tomo slice 45/88.0]
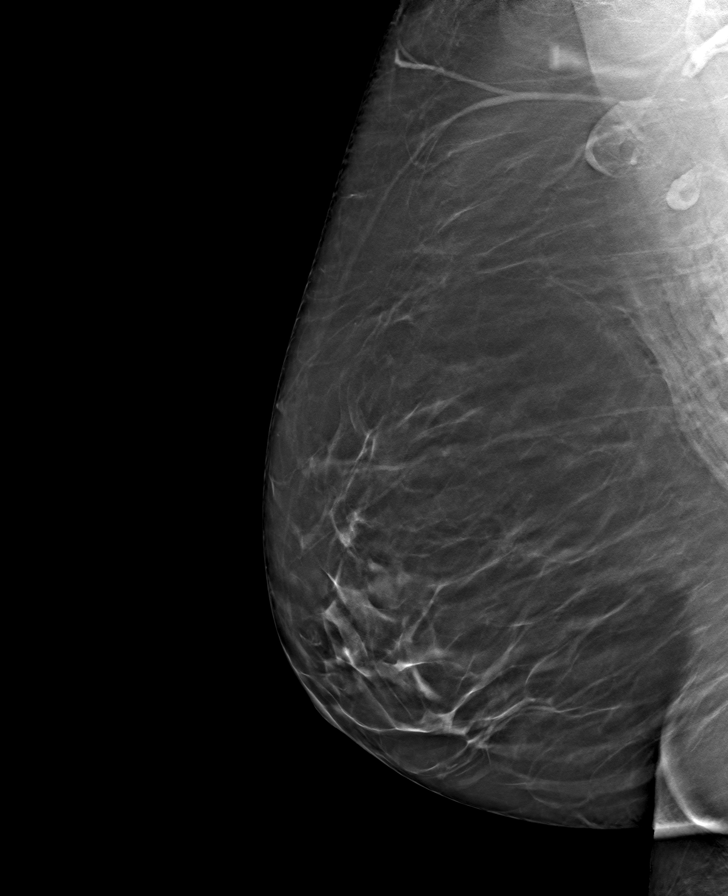

[L MLO tomo · tomo slice 45/89.0]
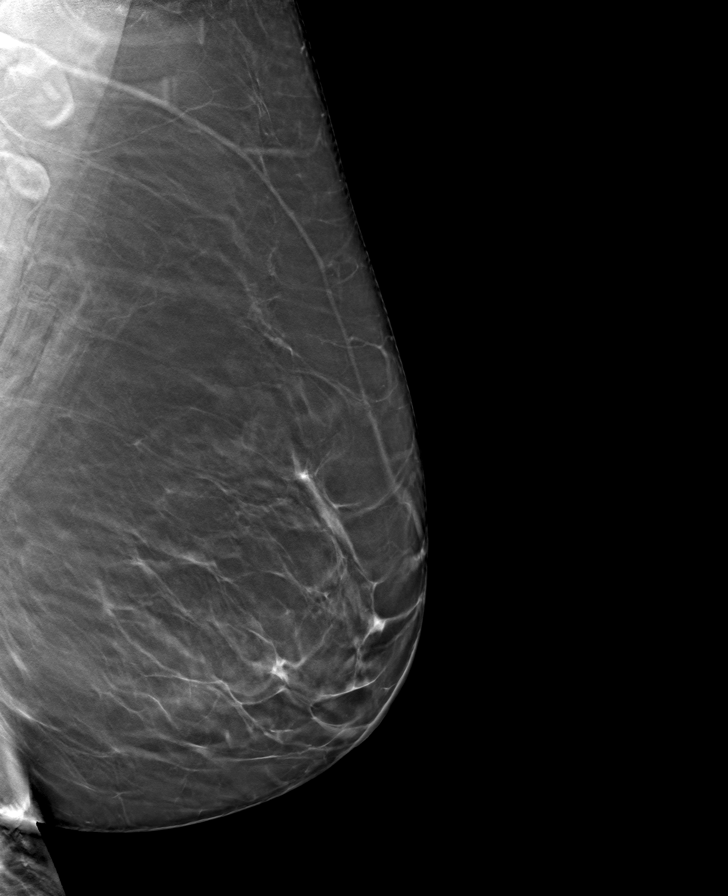

[R CC tomo · tomo slice 45/88.0]
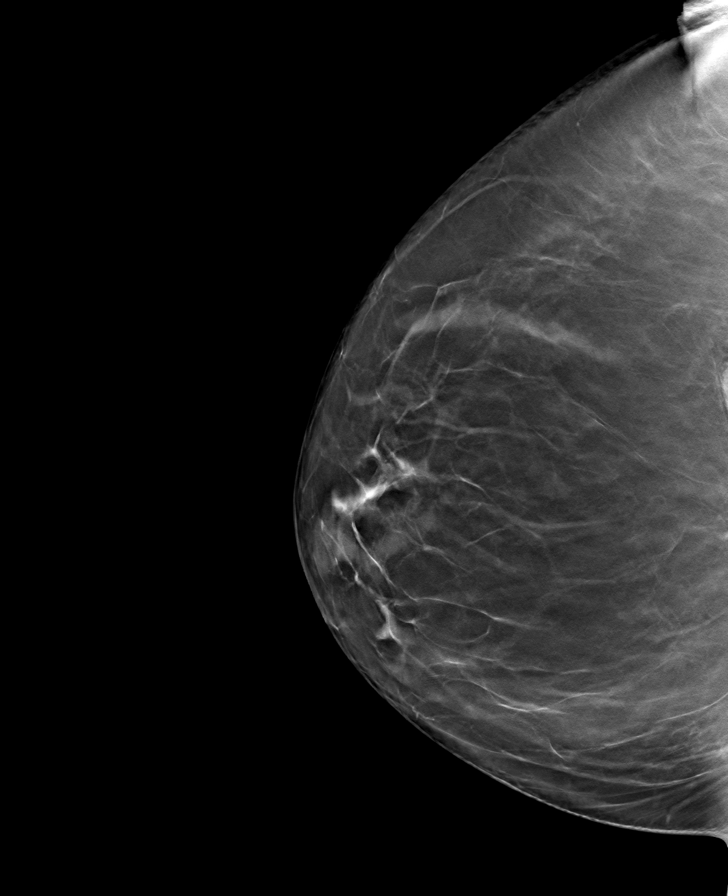

[L CC tomo · tomo slice 45/88.0]
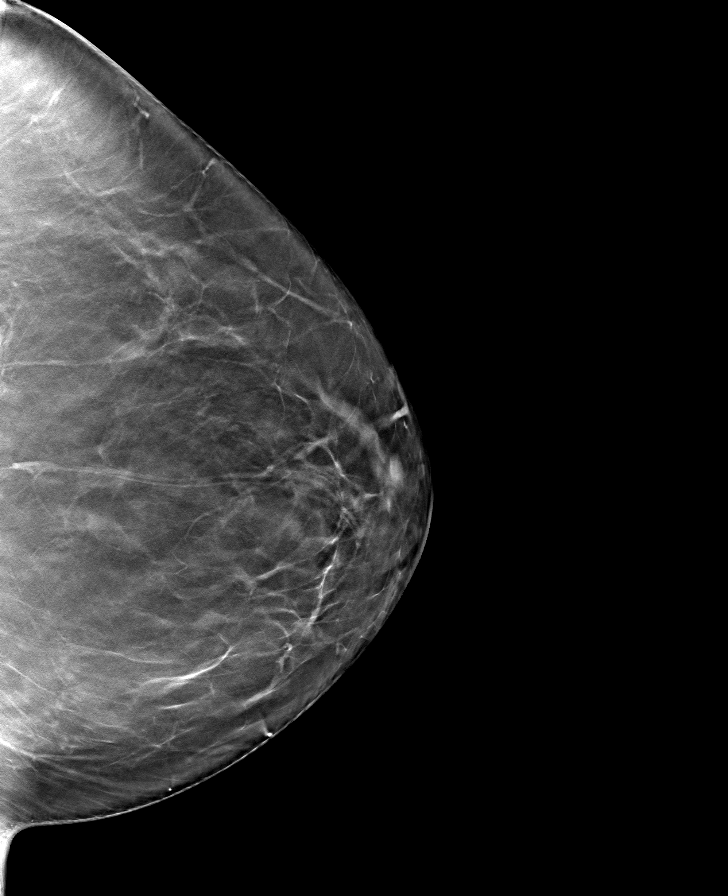

[8 of 24 positions shown; findings below may reference images not displayed]

ACR Breast Density Category b: There are scattered areas of
fibroglandular density.
FINDINGS: There are no findings suspicious for malignancy. Images were
processed with CAD.
IMPRESSION: No mammographic evidence of malignancy. A result letter of this
screening mammogram will be mailed directly to the patient.

RECOMMENDATION:
Screening mammogram in one year. (Code:[id number])

BI-RADS CATEGORY  1: Negative.

## 2021-09-27 ENCOUNTER — Other Ambulatory Visit: Payer: Self-pay | Admitting: Physician Assistant

## 2021-09-28 ENCOUNTER — Other Ambulatory Visit: Payer: Self-pay | Admitting: Physician Assistant

## 2021-09-28 DIAGNOSIS — N644 Mastodynia: Secondary | ICD-10-CM

## 2021-10-11 ENCOUNTER — Other Ambulatory Visit: Payer: Self-pay

## 2021-10-11 ENCOUNTER — Ambulatory Visit
Admission: RE | Admit: 2021-10-11 | Discharge: 2021-10-11 | Disposition: A | Discharge status: Home / Self Care | Payer: BC Managed Care – PPO | Source: Ambulatory Visit | Attending: Physician Assistant | Admitting: Physician Assistant

## 2021-10-11 ENCOUNTER — Ambulatory Visit: Payer: BC Managed Care – PPO

## 2021-10-11 DIAGNOSIS — N644 Mastodynia: Secondary | ICD-10-CM

## 2022-03-27 ENCOUNTER — Ambulatory Visit
Admission: EM | Admit: 2022-03-27 | Discharge: 2022-03-27 | Disposition: A | Discharge status: Home / Self Care | Payer: BC Managed Care – PPO

## 2022-03-27 ENCOUNTER — Encounter: Payer: Self-pay | Admitting: Urgent Care

## 2022-03-27 ENCOUNTER — Other Ambulatory Visit: Payer: Self-pay

## 2022-03-27 DIAGNOSIS — T162XXA Foreign body in left ear, initial encounter: Secondary | ICD-10-CM

## 2022-03-27 DIAGNOSIS — H9202 Otalgia, left ear: Secondary | ICD-10-CM

## 2022-03-27 NOTE — ED Triage Notes (Signed)
Provider triage  

## 2022-03-27 NOTE — ED Provider Notes (Signed)
?[facility] ? ? ?MRN: [id number] DOB: [birth year] ? ?Subjective:  ? ?Taylor Gray is a 66 y.o. female presenting for 2 day history of acute onset foreign body sensation of the left ear.  Patient knows that it is a piece of her hearing aid.  Has previously gotten stuck before but she was able to get it out with her husband's help. ? ?No current facility-administered medications for this encounter. ? ?Current Outpatient Medications:  ?  albuterol (PROVENTIL HFA;VENTOLIN HFA) 108 (90 BASE) MCG/ACT inhaler, Inhale 2 puffs into the lungs every 4 (four) hours as needed for wheezing or shortness of breath (cough, shortness of breath or wheezing.)., Disp: 1 Inhaler, Rfl: 1 ?  benzonatate (TESSALON) 100 MG capsule, Take 1-2 capsules (100-200 mg total) by mouth 3 (three) times daily as needed for cough., Disp: 40 capsule, Rfl: 0 ?  HYDROcodone-homatropine (HYCODAN) 5-1.5 MG/5ML syrup, Take 5 mLs by mouth every 8 (eight) hours as needed for cough., Disp: 120 mL, Rfl: 0  ? ?No Known Allergies ? ?No past medical history on file.  ? ?Past Surgical History:  ?Procedure Laterality Date  ? BUNIONECTOMY    ? TONSILLECTOMY    ? ? ?Family History  ?Problem Relation Age of Onset  ? Heart disease Mother   ? Stroke Mother   ? Cancer Father   ? Heart disease Brother   ? Hyperlipidemia Brother   ? Hyperlipidemia Brother   ? Breast cancer Maternal Aunt   ?     > 50  ? ? ?Social History  ? ?Tobacco Use  ? Smoking status: Never  ? Smokeless tobacco: Never  ?Substance Use Topics  ? Alcohol use: Yes  ?  Alcohol/week: 0.0 standard drinks  ?  Comment: social use  ? Drug use: No  ? ? ?ROS ? ? ?Objective:  ? ?Vitals: ?BP 125/78 (BP Location: Left Arm)   Pulse 60   Temp 98.1 ?F (36.7 ?C) (Oral)   Resp 18   SpO2 97%  ? ?Physical Exam ?Constitutional:   ?   General: She is not in acute distress. ?   Appearance: Normal appearance. She is well-developed. She is not ill-appearing, toxic-appearing or diaphoretic.  ?HENT:  ?   Head:  Normocephalic and atraumatic.  ?   Right Ear: Tympanic membrane, ear canal and external ear normal. No tenderness. There is no impacted cerumen. Tympanic membrane is not injected, perforated, erythematous or bulging.  ?   Left Ear: Tympanic membrane, ear canal and external ear normal. No tenderness. There is no impacted cerumen. Tympanic membrane is not injected, perforated, erythematous or bulging.  ?   Ears:  ?   Comments: Foreign body of the left ear canal visualized. ?   Nose: Nose normal.  ?   Mouth/Throat:  ?   Mouth: Mucous membranes are moist.  ?Eyes:  ?   General: No scleral icterus.    ?   Right eye: No discharge.     ?   Left eye: No discharge.  ?   Extraocular Movements: Extraocular movements intact.  ?Cardiovascular:  ?   Rate and Rhythm: Normal rate.  ?Pulmonary:  ?   Effort: Pulmonary effort is normal.  ?Skin: ?   General: Skin is warm and dry.  ?Neurological:  ?   General: No focal deficit present.  ?   Mental Status: She is alert and oriented to person, place, and time.  ?Psychiatric:     ?   Mood and Affect: Mood normal.     ?  Behavior: Behavior normal.  ? ?Earpiece from the hearing aid successfully removed using alligator forceps. ? ?Assessment and Plan :  ? ?PDMP not reviewed this encounter. ? ?1. Left ear pain   ?2. Foreign body of left ear, initial encounter   ? ?Successful foreign body removal.  Recommend supportive care. Counseled patient on potential for adverse effects with medications prescribed/recommended today, ER and return-to-clinic precautions discussed, patient verbalized understanding. ? ?  ?Wallis Bamberg, PA-C ?03/27/22 1702 ? ?

## 2022-04-12 ENCOUNTER — Other Ambulatory Visit: Payer: Self-pay | Admitting: Physician Assistant

## 2022-04-12 DIAGNOSIS — Z1231 Encounter for screening mammogram for malignant neoplasm of breast: Secondary | ICD-10-CM

## 2022-05-07 ENCOUNTER — Ambulatory Visit
Admission: RE | Admit: 2022-05-07 | Discharge: 2022-05-07 | Disposition: A | Discharge status: Home / Self Care | Payer: BC Managed Care – PPO | Source: Ambulatory Visit | Attending: Physician Assistant | Admitting: Physician Assistant

## 2022-05-07 DIAGNOSIS — Z1231 Encounter for screening mammogram for malignant neoplasm of breast: Secondary | ICD-10-CM

## 2022-05-08 ENCOUNTER — Ambulatory Visit: Payer: BC Managed Care – PPO

## 2022-08-06 IMAGING — MG MM DIGITAL SCREENING BILAT W/ TOMO AND CAD
6 of 10 series · 6 of 30 positions shown · non-contrast
Comparison: Previous exam(s).

CLINICAL DATA: Screening.

EXAM:
DIGITAL SCREENING BILATERAL MAMMOGRAM WITH TOMOSYNTHESIS AND CAD
TECHNIQUE: Bilateral screening digital craniocaudal and mediolateral oblique
mammograms were obtained. Bilateral screening digital breast
tomosynthesis was performed. The images were evaluated with
computer-aided detection.

[L CC synth-2D (1 of 2)]
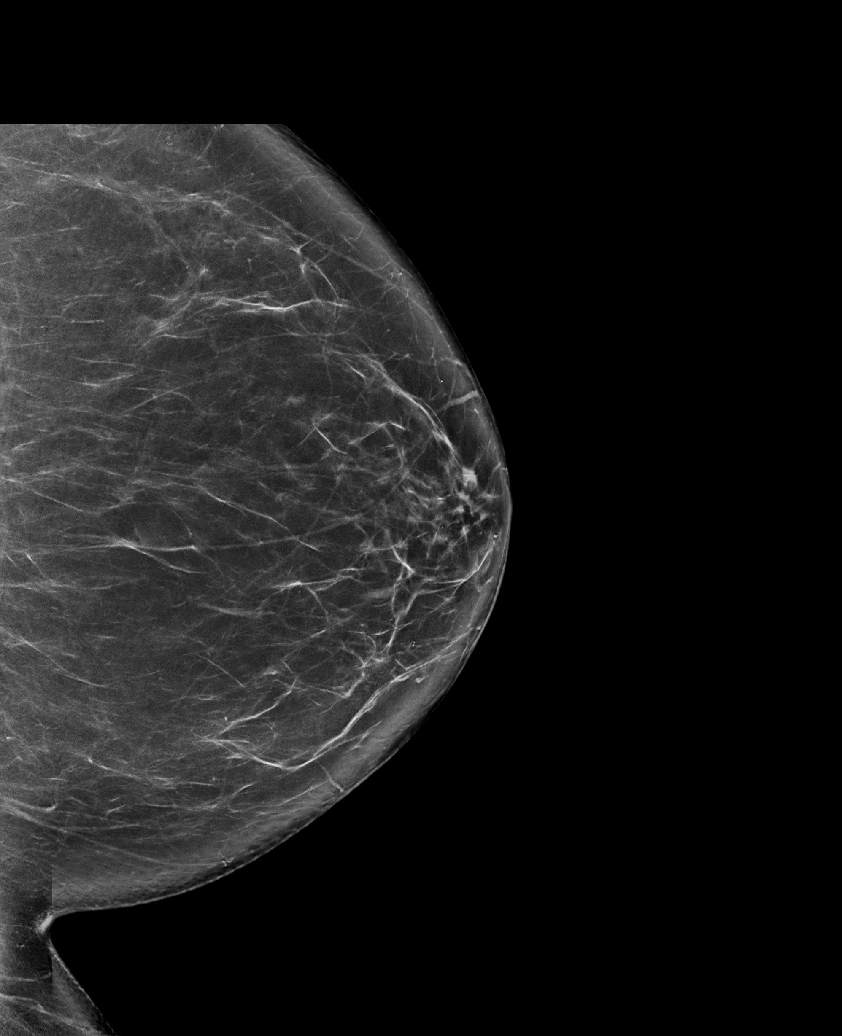

[L MLO synth-2D]
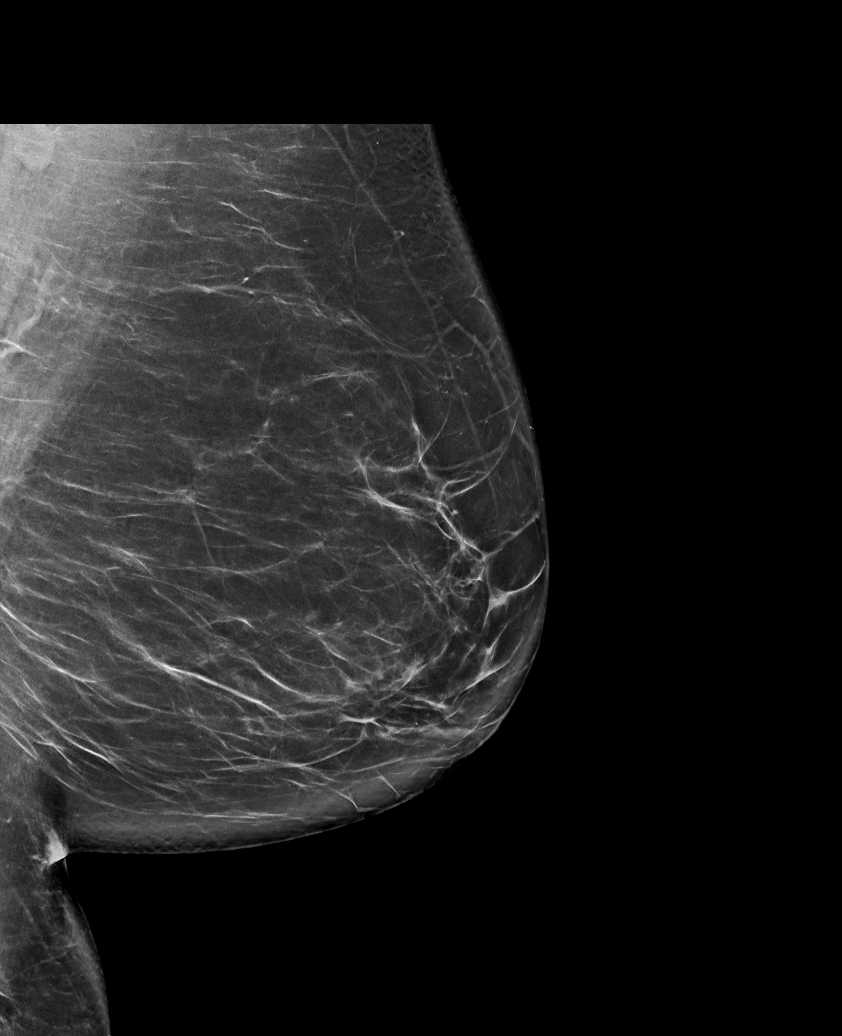

[L CC synth-2D (2 of 2)]
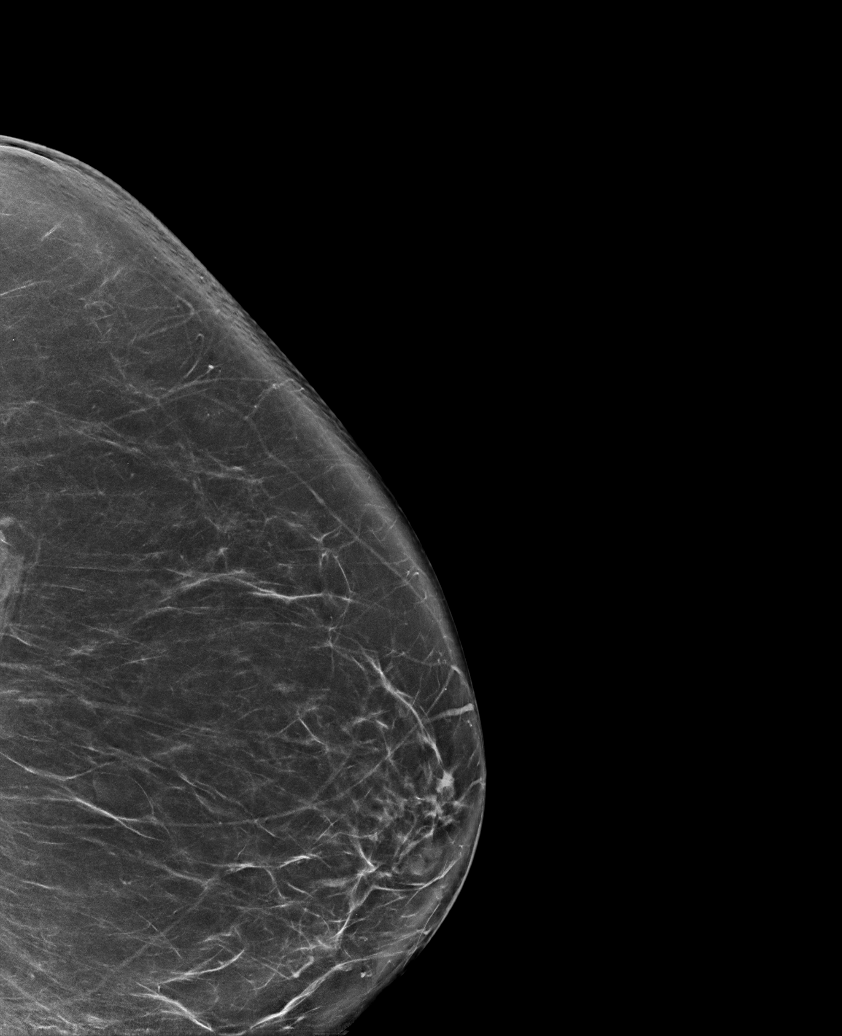

[R MLO synth-2D]
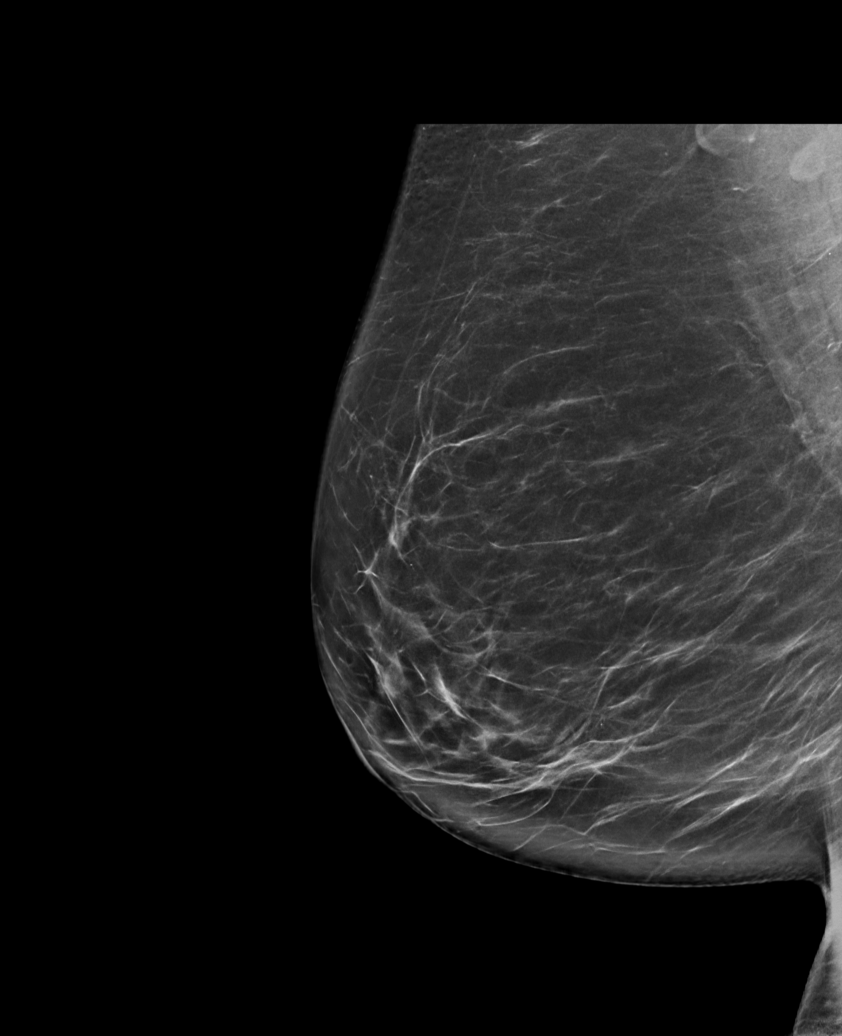

[R CC synth-2D]
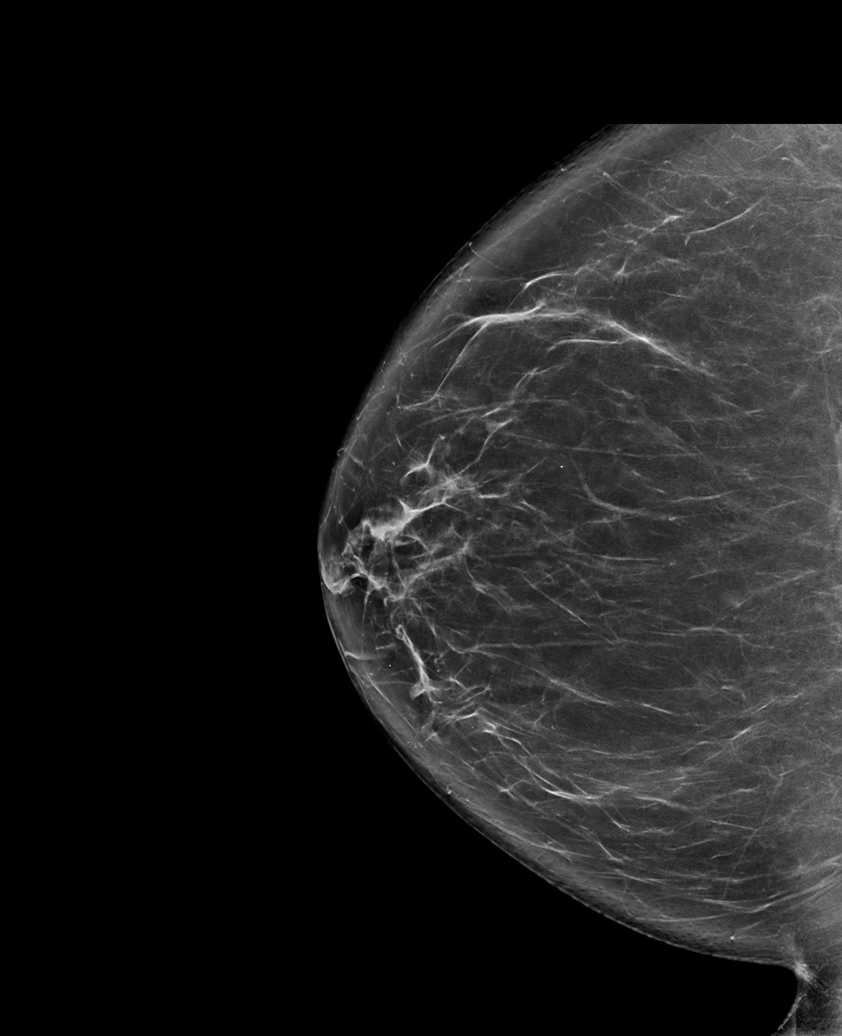

[R MLO tomo · tomo slice 47/93.0]
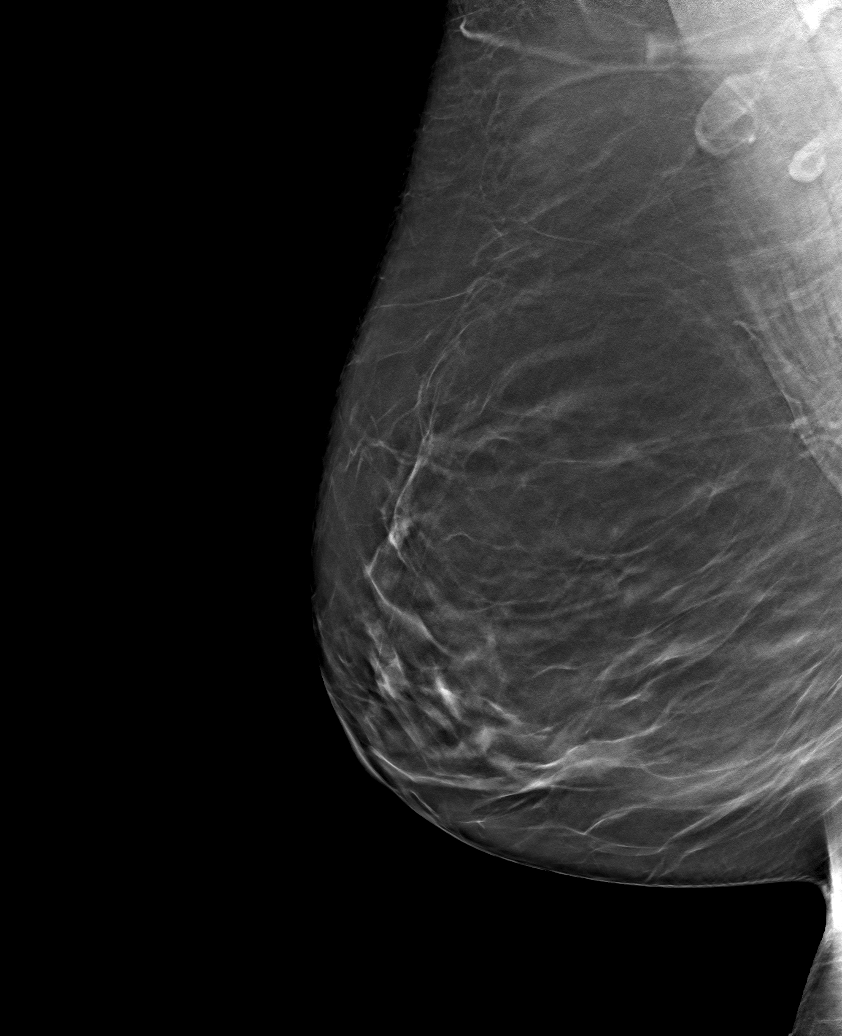

[6 of 30 positions shown; findings below may reference images not displayed]

ACR Breast Density Category b: There are scattered areas of
fibroglandular density.
FINDINGS: There are no findings suspicious for malignancy. The images were
evaluated with computer-aided detection.
IMPRESSION: No mammographic evidence of malignancy. A result letter of this
screening mammogram will be mailed directly to the patient.

RECOMMENDATION:
Screening mammogram in one year. (Code:[id number])

BI-RADS CATEGORY  1: Negative.

## 2023-01-12 IMAGING — MG MM DIGITAL DIAGNOSTIC UNILAT*L* W/ TOMO W/ CAD
4 series · 4 of 12 positions shown · non-contrast
Comparison: Previous exam(s).

CLINICAL DATA: Patient describes sensations in the LEFT breast
which move around, worse during the day than at night. Symptoms for
1 month. She denies any focal pain or palpable mass.

EXAM:
DIGITAL DIAGNOSTIC UNILATERAL LEFT MAMMOGRAM WITH TOMOSYNTHESIS AND
CAD
TECHNIQUE: Left digital diagnostic mammography and breast tomosynthesis was
performed. The images were evaluated with computer-aided detection.

[L MLO synth-2D]
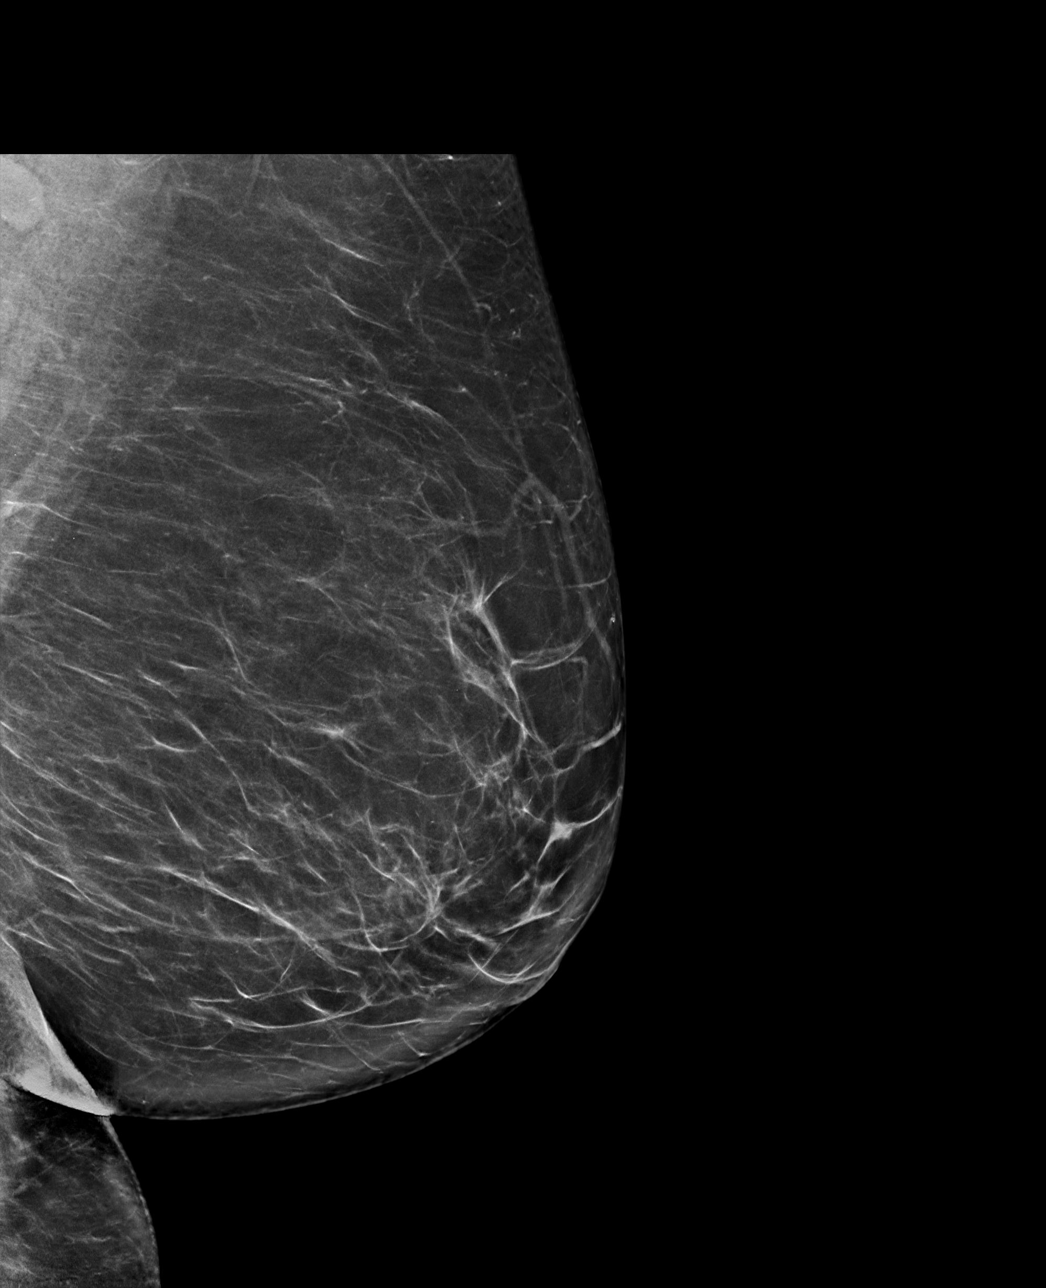

[L CC synth-2D]
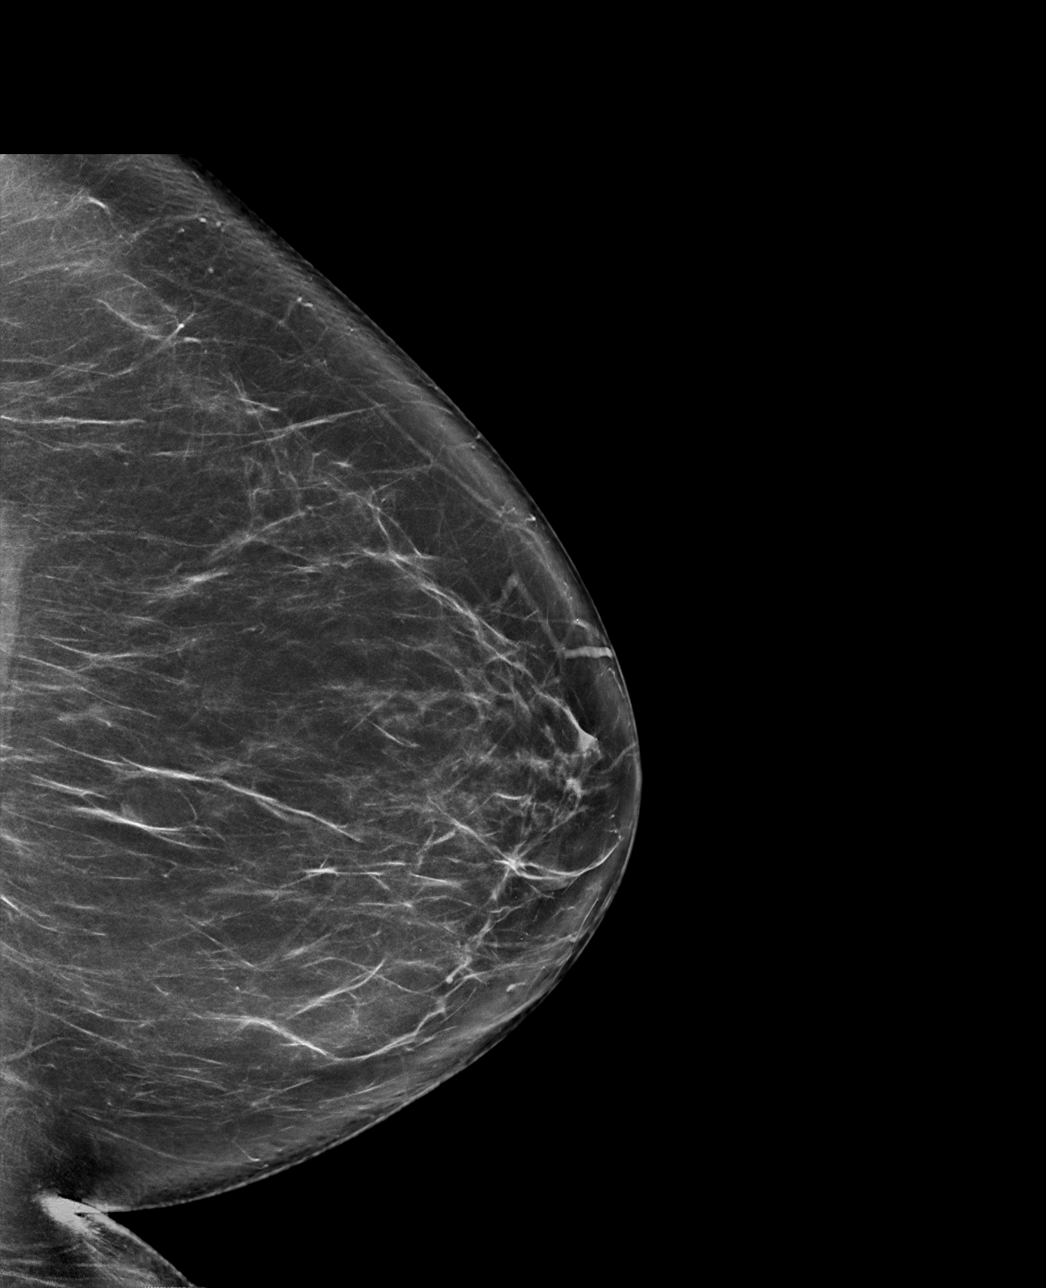

[L CC tomo · tomo slice 45/89.0]
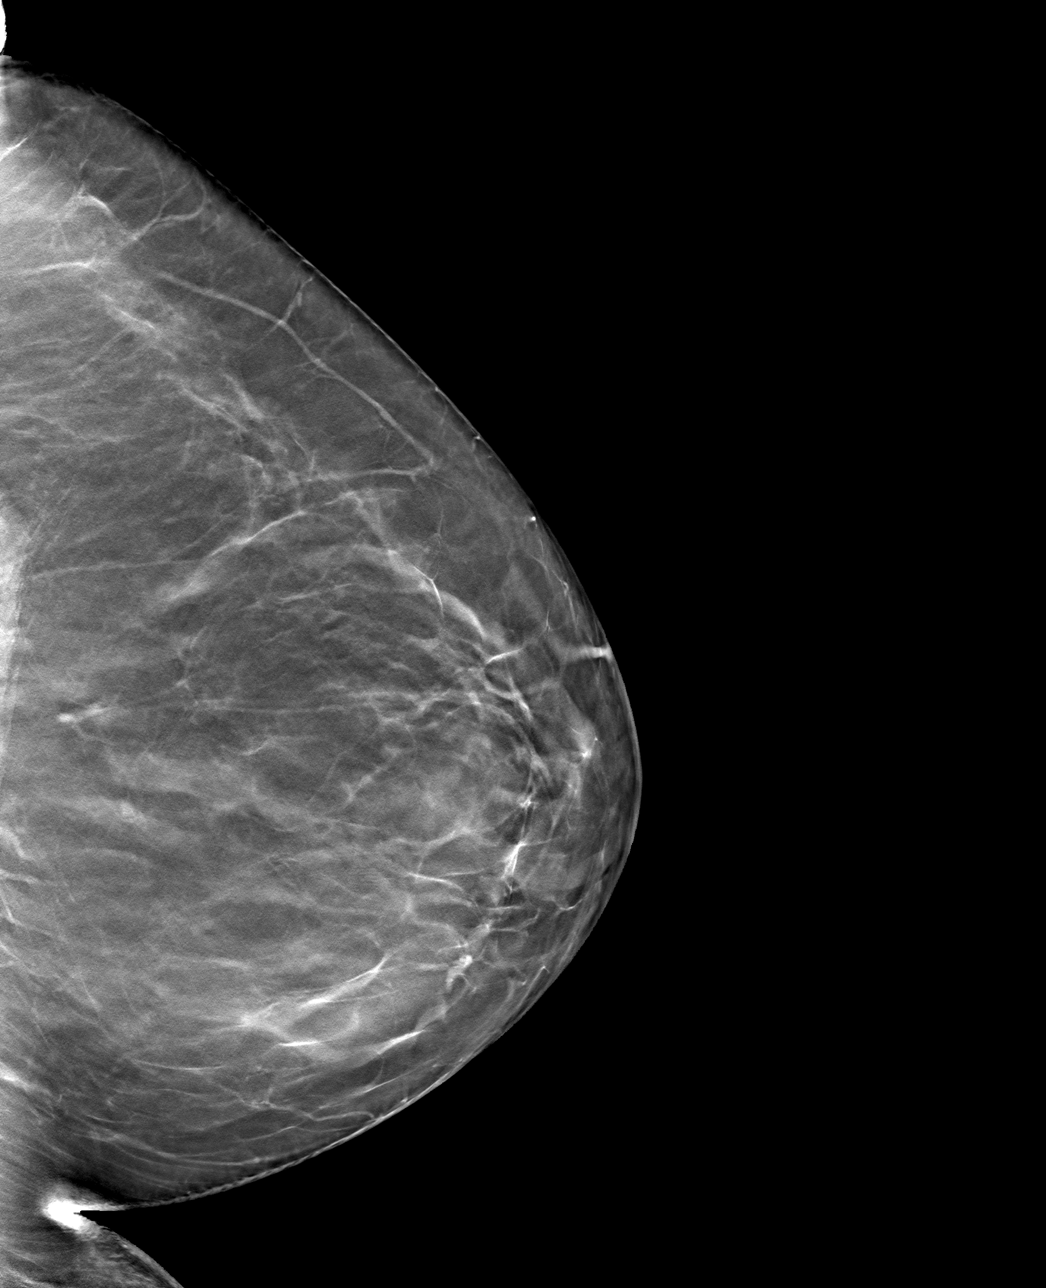

[L MLO tomo · tomo slice 45/88.0]
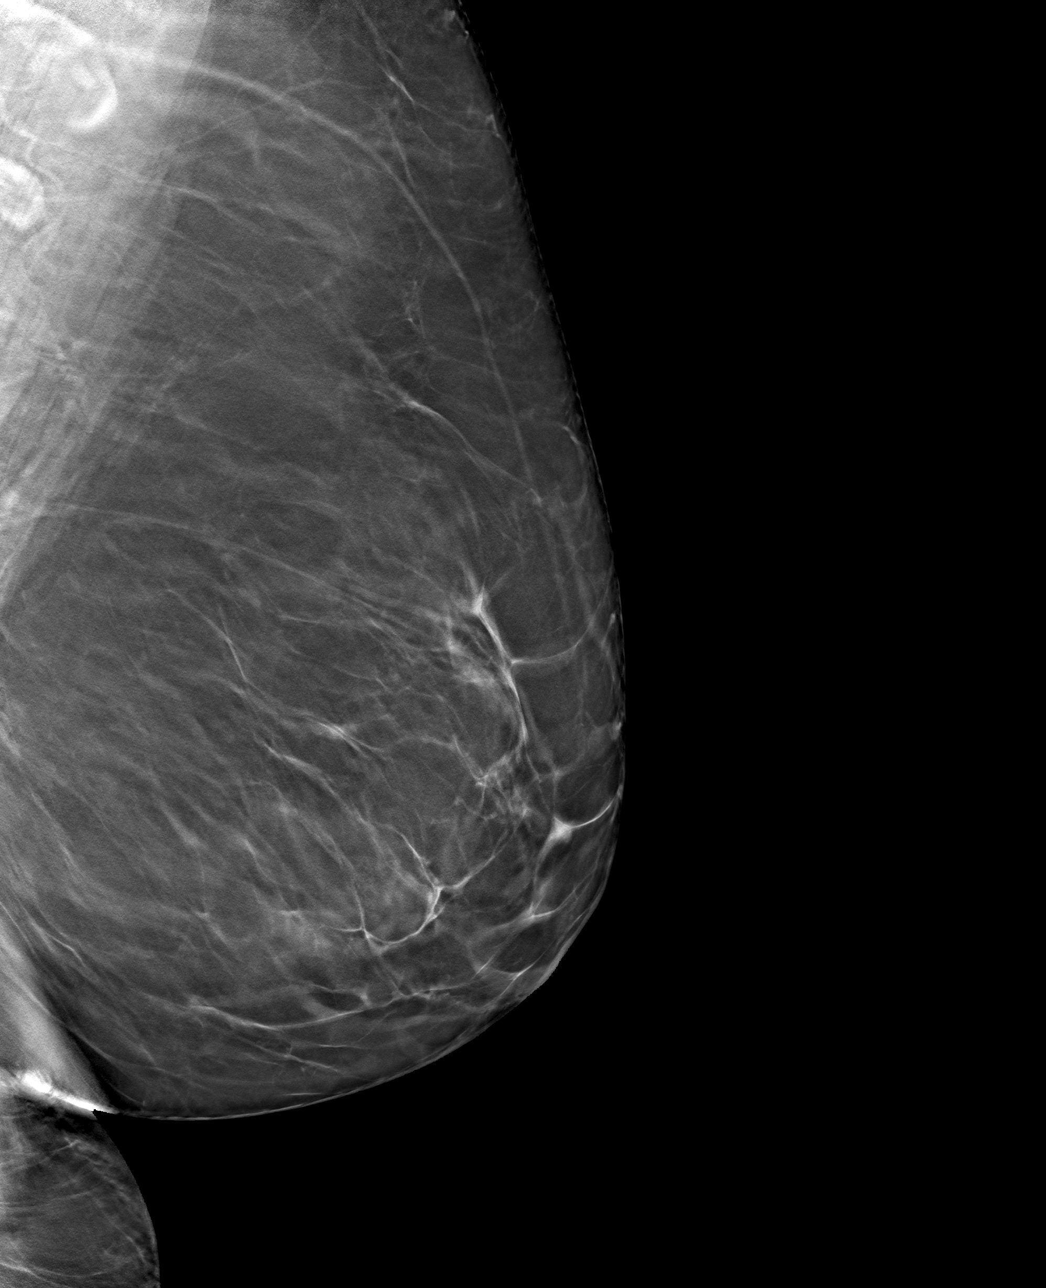

[4 of 12 positions shown; findings below may reference images not displayed]

ACR Breast Density Category b: There are scattered areas of
fibroglandular density.
FINDINGS: No suspicious mass, distortion, or microcalcifications are
identified to suggest presence of malignancy.
IMPRESSION: No mammographic evidence for malignancy.

RECOMMENDATION:
Screening mammogram is recommended in May 2022.

I have discussed the findings and recommendations with the patient.
If applicable, a reminder letter will be sent to the patient
regarding the next appointment.

BI-RADS CATEGORY  1: Negative.

## 2023-04-01 ENCOUNTER — Other Ambulatory Visit: Payer: Self-pay | Admitting: Physician Assistant

## 2023-04-01 DIAGNOSIS — Z0000 Encounter for general adult medical examination without abnormal findings: Secondary | ICD-10-CM

## 2023-05-09 ENCOUNTER — Ambulatory Visit: Payer: BC Managed Care – PPO

## 2023-05-23 ENCOUNTER — Ambulatory Visit
Admission: RE | Admit: 2023-05-23 | Discharge: 2023-05-23 | Disposition: A | Discharge status: Home / Self Care | Payer: BC Managed Care – PPO | Source: Ambulatory Visit | Attending: Physician Assistant | Admitting: Physician Assistant

## 2023-05-23 DIAGNOSIS — Z0000 Encounter for general adult medical examination without abnormal findings: Secondary | ICD-10-CM

## 2024-04-08 ENCOUNTER — Other Ambulatory Visit: Payer: Self-pay | Admitting: Physician Assistant

## 2024-04-08 DIAGNOSIS — Z1231 Encounter for screening mammogram for malignant neoplasm of breast: Secondary | ICD-10-CM

## 2024-05-25 ENCOUNTER — Ambulatory Visit
Admission: RE | Admit: 2024-05-25 | Discharge: 2024-05-25 | Disposition: A | Discharge status: Home / Self Care | Payer: Self-pay | Source: Ambulatory Visit | Attending: Physician Assistant | Admitting: Physician Assistant

## 2024-05-25 ENCOUNTER — Ambulatory Visit: Payer: Self-pay

## 2024-05-25 DIAGNOSIS — Z1231 Encounter for screening mammogram for malignant neoplasm of breast: Secondary | ICD-10-CM
